# Patient Record
Sex: Female | Born: 1935 | Hispanic: No | Marital: Single | Smoking: Never smoker
Health system: Southern US, Community
[De-identification: ages and names within clinical notes are randomized; demographics above are authoritative.]

---
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
## 2005-12-24 ENCOUNTER — Ambulatory Visit: Payer: Self-pay

## 2014-05-25 ENCOUNTER — Encounter (HOSPITAL_COMMUNITY): Payer: Self-pay | Admitting: Emergency Medicine

## 2014-05-25 ENCOUNTER — Emergency Department (HOSPITAL_COMMUNITY)
Admission: EM | Admit: 2014-05-25 | Discharge: 2014-05-25 | Disposition: A | Discharge status: Home / Self Care | Payer: Medicare Other | Attending: Emergency Medicine | Admitting: Emergency Medicine

## 2014-05-25 ENCOUNTER — Emergency Department (HOSPITAL_COMMUNITY): Payer: Medicare Other

## 2014-05-25 DIAGNOSIS — G510 Bell's palsy: Secondary | ICD-10-CM

## 2014-05-25 LAB — APTT: aPTT: 32 seconds (ref 24–37)

## 2014-05-25 LAB — COMPREHENSIVE METABOLIC PANEL
ALK PHOS: 72 U/L (ref 39–117)
ALT: 13 U/L (ref 0–35)
AST: 23 U/L (ref 0–37)
Albumin: 4.1 g/dL (ref 3.5–5.2)
Anion gap: 16 — ABNORMAL HIGH (ref 5–15)
BUN: 9 mg/dL (ref 6–23)
CO2: 24 mEq/L (ref 19–32)
Calcium: 9.6 mg/dL (ref 8.4–10.5)
Chloride: 101 mEq/L (ref 96–112)
Creatinine, Ser: 0.58 mg/dL (ref 0.50–1.10)
GFR calc non Af Amer: 86 mL/min — ABNORMAL LOW (ref >90)
GLUCOSE: 100 mg/dL — AB (ref 70–99)
POTASSIUM: 3.8 meq/L (ref 3.7–5.3)
Sodium: 141 mEq/L (ref 137–147)
Total Bilirubin: <0.2 mg/dL — ABNORMAL LOW (ref 0.3–1.2)
Total Protein: 7.5 g/dL (ref 6.0–8.3)

## 2014-05-25 LAB — CBC
HEMATOCRIT: 38.4 % (ref 36.0–46.0)
HEMOGLOBIN: 12.9 g/dL (ref 12.0–15.0)
MCH: 30.5 pg (ref 26.0–34.0)
MCHC: 33.6 g/dL (ref 30.0–36.0)
MCV: 90.8 fL (ref 78.0–100.0)
Platelets: 285 10*3/uL (ref 150–400)
RBC: 4.23 MIL/uL (ref 3.87–5.11)
RDW: 13.7 % (ref 11.5–15.5)
WBC: 5.2 10*3/uL (ref 4.0–10.5)

## 2014-05-25 LAB — DIFFERENTIAL
Basophils Absolute: 0 10*3/uL (ref 0.0–0.1)
Basophils Relative: 1 % (ref 0–1)
EOS ABS: 0.1 10*3/uL (ref 0.0–0.7)
Eosinophils Relative: 2 % (ref 0–5)
LYMPHS ABS: 1.3 10*3/uL (ref 0.7–4.0)
Lymphocytes Relative: 25 % (ref 12–46)
MONOS PCT: 9 % (ref 3–12)
Monocytes Absolute: 0.5 10*3/uL (ref 0.1–1.0)
Neutro Abs: 3.3 10*3/uL (ref 1.7–7.7)
Neutrophils Relative %: 63 % (ref 43–77)

## 2014-05-25 LAB — PROTIME-INR
INR: 0.97 (ref 0.00–1.49)
Prothrombin Time: 12.9 seconds (ref 11.6–15.2)

## 2014-05-25 LAB — I-STAT TROPONIN, ED: Troponin i, poc: 0 ng/mL (ref 0.00–0.08)

## 2014-05-25 MED ORDER — PREDNISONE 50 MG PO TABS: 50.0000 mg | ORAL_TABLET | Freq: Every day | ORAL | Status: DC

## 2014-05-25 MED ORDER — ARTIFICIAL TEARS OP OINT
TOPICAL_OINTMENT | Freq: Once | OPHTHALMIC | Status: AC
  Administered 2014-05-25: 20:00:00 via OPHTHALMIC
  Filled 2014-05-25: qty 3.5

## 2014-05-25 NOTE — ED Provider Notes (Signed)
CSN: [id number]     Arrival date & time 05/25/14  1457 History   First MD Initiated Contact with Patient 05/25/14 1704     Chief Complaint  Patient presents with  . Facial Droop     (Consider location/radiation/quality/duration/timing/severity/associated sxs/prior Treatment) HPI Comments: Pt with no medical hx comes in with right sided facial droop since Friday. Pt had sudden onset facial droop. She thought she had bell's palsy, but got worried when her sx didn't improve. No hx of strokes. She has some pain in her right eye and tearing. No change in vision.   The history is provided by the patient.    History reviewed. No pertinent past medical history. History reviewed. No pertinent past surgical history. History reviewed. No pertinent family history. History  Substance Use Topics  . Smoking status: Never Smoker   . Smokeless tobacco: Not on file  . Alcohol Use: Yes     Comment: occ   OB History   Grav Para Term Preterm Abortions TAB SAB Ect Mult Living                 Review of Systems  Constitutional: Negative for activity change.  Eyes: Positive for photophobia, pain, discharge and itching. Negative for redness and visual disturbance.  Respiratory: Negative for shortness of breath.   Cardiovascular: Negative for chest pain.  Gastrointestinal: Negative for nausea.  Neurological: Positive for facial asymmetry. Negative for dizziness, tremors, syncope, speech difficulty, weakness and numbness.      Allergies  Other  Home Medications   Prior to Admission medications   Medication Sig Start Date End Date Taking? Authorizing Provider  predniSONE (DELTASONE) 50 MG tablet Take 1 tablet (50 mg total) by mouth daily. 05/25/14   Cejay Cambre Rhunette CroftNanavati, MD   BP 149/87  Pulse 78  Temp(Src) 98.6 F (37 C) (Oral)  Resp 25  Ht 5\' 1"  (1.549 m)  Wt 95 lb (43.092 kg)  BMI 17.96 kg/m2  SpO2 100% Physical Exam  Nursing note and vitals reviewed. Constitutional: She is oriented to  person, place, and time. She appears well-developed and well-nourished.  HENT:  Head: Normocephalic and atraumatic.  Eyes: EOM are normal. Pupils are equal, round, and reactive to light.  Neck: Neck supple.  Cardiovascular: Normal rate, regular rhythm and normal heart sounds.   No murmur heard. Pulmonary/Chest: Effort normal. No respiratory distress.  Abdominal: Soft. She exhibits no distension. There is no tenderness. There is no rebound and no guarding.  Neurological: She is alert and oriented to person, place, and time.  Right sided facial hemiparesis. No central sparing. Sensory exam for the face and bilateral upper and lower extremity is normal. Cerebellar exam - finger to nose and gait is normal. Motor exam for upper and lower extremity bilaterally is normal.  Skin: Skin is warm and dry.    ED Course  Procedures (including critical care time) Labs Review Labs Reviewed  COMPREHENSIVE METABOLIC PANEL - Abnormal; Notable for the following:    Glucose, Bld 100 (*)    Total Bilirubin <0.2 (*)    GFR calc non Af Amer 86 (*)    Anion gap 16 (*)    All other components within normal limits  PROTIME-INR  APTT  CBC  DIFFERENTIAL  I-STAT TROPOININ, ED    Imaging Review Ct Head (brain) Wo Contrast  05/25/2014   CLINICAL DATA:  Possible facial droop, facial paresthesias  EXAM: CT HEAD WITHOUT CONTRAST  TECHNIQUE: Contiguous axial images were obtained from the base of  the skull through the vertex without contrast.  COMPARISON:  None  FINDINGS: Normal appearance of the intracranial structures. No evidence for acute hemorrhage, mass lesion, midline shift, hydrocephalus or large infarct. No acute bony abnormality. The visualized sinuses are clear.  IMPRESSION: No acute intracranial abnormality.   Electronically Signed   By: Ruel Favors M.D.   On: 05/25/2014 17:19     EKG Interpretation   Date/Time:  Tuesday May 25 2014 15:12:22 EDT Ventricular Rate:  90 PR Interval:  126 QRS  Duration: 86 QT Interval:  362 QTC Calculation: 442 R Axis:   56 Text Interpretation:  Normal sinus rhythm Nonspecific ST abnormality  Abnormal ECG Confirmed by Rhunette Croft, MD, Janey Genta [phone]) on 05/25/2014 6:58:30  PM      MDM   Final diagnoses:  Bell's palsy    PT comes in with right sided facial numbness. Clinically has Bell's palsy. Triage had activated the stroke panel - and thus patient did get CT head which shows no stroke evidence. Outside the tx window for the valcyclovir, Aritificial tears provided from the ER and prednisone was prescribed. Pt advised to use an eye pad and glasses when outside, pcp f/u in 1 week.  Derwood Kaplan, MD 05/25/14 1946

## 2014-05-25 NOTE — ED Notes (Signed)
Pt reports having facial droop since Friday. Pt has not been seen by md yet bc she has assumed it is bells palsy. Right side facial droop noted, having watering to right eye, reports symptoms having intensified today and increase in droop, numbness to lower lip and now pain to right side of neck.

## 2014-05-25 NOTE — Discharge Instructions (Signed)
You have Bell's Palsy. Please take the steroids as prescribed. See your doctor in 1 week time. Use the artifical tears and eye patch/glasses.  Bell's Palsy Bell's palsy is a condition in which the muscles on one side of the face cannot move (paralysis). This is because the nerves in the face are paralyzed. It is most often thought to be caused by a virus. The virus causes swelling of the nerve that controls movement on one side of the face. The nerve travels through a tight space surrounded by bone. When the nerve swells, it can be compressed by the bone. This results in damage to the protective covering around the nerve. This damage interferes with how the nerve communicates with the muscles of the face. As a result, it can cause weakness or paralysis of the facial muscles.  Injury (trauma), tumor, and surgery may cause Bell's palsy, but most of the time the cause is unknown. It is a relatively common condition. It starts suddenly (abrupt onset) with the paralysis usually ending within 2 days. Bell's palsy is not dangerous. But because the eye does not close properly, you may need care to keep the eye from getting dry. This can include splinting (to keep the eye shut) or moistening with artificial tears. Bell's palsy very seldom occurs on both sides of the face at the same time. SYMPTOMS   Eyebrow sagging.  Drooping of the eyelid and corner of the mouth.  Inability to close one eye.  Loss of taste on the front of the tongue.  Sensitivity to loud noises. TREATMENT  The treatment is usually non-surgical. If the patient is seen within the first 24 to 48 hours, a short course of steroids may be prescribed, in an attempt to shorten the length of the condition. Antiviral medicines may also be used with the steroids, but it is unclear if they are helpful.  You will need to protect your eye, if you cannot close it. The cornea (clear covering over your eye) will become dry and can be damaged. Artificial  tears can be used to keep your eye moist. Glasses or an eye patch should be worn to protect your eye. PROGNOSIS  Recovery is variable, ranging from days to months. Although the problem usually goes away completely (about 80% of cases resolve), predicting the outcome is impossible. Most people improve within 3 weeks of when the symptoms began. Improvement may continue for 3 to 6 months. A small number of people have moderate to severe weakness that is permanent.  HOME CARE INSTRUCTIONS   If your caregiver prescribed medication to reduce swelling in the nerve, use as directed. Do not stop taking the medication unless directed by your caregiver.  Use moisturizing eye drops as needed to prevent drying of your eye, as directed by your caregiver.  Protect your eye, as directed by your caregiver.  Use facial massage and exercises, as directed by your caregiver.  Perform your normal activities, and get your normal rest. SEEK IMMEDIATE MEDICAL CARE IF:   There is pain, redness or irritation in the eye.  You or your child has an oral temperature above 102 F (38.9 C), not controlled by medicine. MAKE SURE YOU:   Understand these instructions.  Will watch your condition.  Will get help right away if you are not doing well or get worse. Document Released: 10/22/2005 Document Revised: 01/14/2012 Document Reviewed: 10/31/2009 [facility] Patient Information 2015 [place], [place]. This information is not intended to replace advice given to you by your  health care provider. Make sure you discuss any questions you have with your health care provider. ° °

## 2014-05-25 NOTE — ED Notes (Signed)
Waiting for medication from pharmacy.

## 2016-03-08 DIAGNOSIS — H5203 Hypermetropia, bilateral: Secondary | ICD-10-CM | POA: Diagnosis not present

## 2016-03-30 DIAGNOSIS — L509 Urticaria, unspecified: Secondary | ICD-10-CM | POA: Diagnosis not present

## 2016-03-30 DIAGNOSIS — W57XXXA Bitten or stung by nonvenomous insect and other nonvenomous arthropods, initial encounter: Secondary | ICD-10-CM | POA: Diagnosis not present

## 2016-10-11 DIAGNOSIS — Z0000 Encounter for general adult medical examination without abnormal findings: Secondary | ICD-10-CM | POA: Diagnosis not present

## 2016-10-11 DIAGNOSIS — Z780 Asymptomatic menopausal state: Secondary | ICD-10-CM | POA: Diagnosis not present

## 2016-10-22 DIAGNOSIS — Z0000 Encounter for general adult medical examination without abnormal findings: Secondary | ICD-10-CM | POA: Diagnosis not present

## 2016-10-22 DIAGNOSIS — E7800 Pure hypercholesterolemia, unspecified: Secondary | ICD-10-CM | POA: Diagnosis not present

## 2016-11-07 DIAGNOSIS — H04123 Dry eye syndrome of bilateral lacrimal glands: Secondary | ICD-10-CM | POA: Diagnosis not present

## 2016-11-07 DIAGNOSIS — H01004 Unspecified blepharitis left upper eyelid: Secondary | ICD-10-CM | POA: Diagnosis not present

## 2016-11-07 DIAGNOSIS — H01001 Unspecified blepharitis right upper eyelid: Secondary | ICD-10-CM | POA: Diagnosis not present

## 2016-11-12 DIAGNOSIS — M810 Age-related osteoporosis without current pathological fracture: Secondary | ICD-10-CM | POA: Diagnosis not present

## 2017-04-08 DIAGNOSIS — N390 Urinary tract infection, site not specified: Secondary | ICD-10-CM | POA: Diagnosis not present

## 2017-04-08 DIAGNOSIS — R300 Dysuria: Secondary | ICD-10-CM | POA: Diagnosis not present

## 2017-04-25 DIAGNOSIS — N390 Urinary tract infection, site not specified: Secondary | ICD-10-CM | POA: Diagnosis not present

## 2017-04-25 DIAGNOSIS — R300 Dysuria: Secondary | ICD-10-CM | POA: Diagnosis not present

## 2017-04-25 DIAGNOSIS — Z01419 Encounter for gynecological examination (general) (routine) without abnormal findings: Secondary | ICD-10-CM | POA: Diagnosis not present

## 2017-04-29 DIAGNOSIS — H04123 Dry eye syndrome of bilateral lacrimal glands: Secondary | ICD-10-CM | POA: Diagnosis not present

## 2017-04-29 DIAGNOSIS — H35373 Puckering of macula, bilateral: Secondary | ICD-10-CM | POA: Diagnosis not present

## 2017-07-17 ENCOUNTER — Encounter (HOSPITAL_COMMUNITY): Payer: Self-pay | Admitting: Emergency Medicine

## 2017-07-17 ENCOUNTER — Emergency Department (HOSPITAL_COMMUNITY): Payer: Medicare Other

## 2017-07-17 ENCOUNTER — Emergency Department (HOSPITAL_COMMUNITY)
Admission: EM | Admit: 2017-07-17 | Discharge: 2017-07-17 | Disposition: A | Discharge status: Home / Self Care | Payer: Medicare Other | Attending: Emergency Medicine | Admitting: Emergency Medicine

## 2017-07-17 DIAGNOSIS — Y929 Unspecified place or not applicable: Secondary | ICD-10-CM | POA: Insufficient documentation

## 2017-07-17 DIAGNOSIS — M25571 Pain in right ankle and joints of right foot: Secondary | ICD-10-CM | POA: Diagnosis not present

## 2017-07-17 DIAGNOSIS — W108XXA Fall (on) (from) other stairs and steps, initial encounter: Secondary | ICD-10-CM | POA: Insufficient documentation

## 2017-07-17 DIAGNOSIS — S93401A Sprain of unspecified ligament of right ankle, initial encounter: Secondary | ICD-10-CM | POA: Diagnosis not present

## 2017-07-17 DIAGNOSIS — Y999 Unspecified external cause status: Secondary | ICD-10-CM | POA: Diagnosis not present

## 2017-07-17 DIAGNOSIS — Y939 Activity, unspecified: Secondary | ICD-10-CM | POA: Diagnosis not present

## 2017-07-17 DIAGNOSIS — S99911A Unspecified injury of right ankle, initial encounter: Secondary | ICD-10-CM | POA: Diagnosis present

## 2017-07-17 MED ORDER — ACETAMINOPHEN 500 MG PO TABS
1000.0000 mg | ORAL_TABLET | Freq: Once | ORAL | Status: AC
  Administered 2017-07-17: 1000 mg via ORAL
  Filled 2017-07-17: qty 2

## 2017-07-17 MED ORDER — ACETAMINOPHEN 500 MG PO TABS: 500.0000 mg | ORAL_TABLET | Freq: Four times a day (QID) | ORAL | 0 refills | Status: AC | PRN

## 2017-07-17 NOTE — ED Notes (Signed)
Patient returned from X-ray 

## 2017-07-17 NOTE — ED Provider Notes (Signed)
[facility]-EMERGENCY DEPT Provider Note   CSN: [id number] Arrival date & time: 07/17/17  0547     History   Chief Complaint Chief Complaint  Patient presents with  . Ankle Pain    HPI Samantha Burns is a 83 y.o. female.  HPI   83 year old female presenting for evaluation of right ankle injury. Pt report she was stepping down from a step stool last night when she missteped and inverted her R ankle.  She felt a pop follow with sharp non radiating pain to the ankle.  Denies falling or injuring anything else.  Denies hitting head, LOC or having any precipitating sxs.  She's able to ambulate with some difficulty. Pt have been icing and elevating her ankle.  She is scheduled to fly to [place] tomorrow for a 10 days hiking trip.  Report prior fx of R ankle and was cared for by [facility].      History reviewed. No pertinent past medical history.  There are no active problems to display for this patient.   History reviewed. No pertinent surgical history.  OB History    No data available       Home Medications    Prior to Admission medications   Medication Sig Start Date End Date Taking? Authorizing Provider  predniSONE (DELTASONE) 50 MG tablet Take 1 tablet (50 mg total) by mouth daily. 05/25/14   Derwood Kaplan, MD    Family History No family history on file.  Social History Social History  Substance Use Topics  . Smoking status: Never Smoker  . Smokeless tobacco: Never Used  . Alcohol use Yes     Comment: occ     Allergies   Other   Review of Systems Review of Systems  Constitutional: Negative for fever.  Musculoskeletal: Positive for joint swelling.  Skin: Negative for wound.  Neurological: Negative for numbness.     Physical Exam Updated Vital Signs BP (!) 168/98 (BP Location: Right Arm)   Pulse 88   Temp 98.5 F (36.9 C) (Oral)   Resp 18   SpO2 99%   Physical Exam  Constitutional: She appears well-developed and well-nourished. No  distress.  HENT:  Head: Atraumatic.  Eyes: Conjunctivae are normal.  Neck: Neck supple.  Musculoskeletal: She exhibits tenderness (R ankle: point tenderness to lateral malleolar region with mild swelling. FROM, pedal pulses intact.  no pain at 5th metatarsal region.).  Neurological: She is alert.  Skin: No rash noted.  Psychiatric: She has a normal mood and affect.  Nursing note and vitals reviewed.    ED Treatments / Results  Labs (all labs ordered are listed, but only abnormal results are displayed) Labs Reviewed - No data to display  EKG  EKG Interpretation None       Radiology Dg Ankle Complete Right  Result Date: 07/17/2017 CLINICAL DATA:  Right ankle pain after injury. Twisting injury stepping off the stool with pain laterally. Cannot bear weight. EXAM: RIGHT ANKLE - COMPLETE 3+ VIEW COMPARISON:  None. FINDINGS: There is no evidence of fracture, dislocation, or joint effusion. The bones are under mineralized. There is no evidence of arthropathy or other focal bone abnormality. Mild anterior soft tissue edema. IMPRESSION: No fracture or subluxation of the right ankle. Electronically Signed   By: Rubye Oaks M.D.   On: 07/17/2017 06:49    Procedures Procedures (including critical care time)  Medications Ordered in ED Medications  acetaminophen (TYLENOL) tablet 1,000 mg (1,000 mg Oral Given 07/17/17 0657)  Initial Impression / Assessment and Plan / ED Course  I have reviewed the triage vital signs and the nursing notes.  Pertinent labs & imaging results that were available during my care of the patient were reviewed by me and considered in my medical decision making (see chart for details).     BP (!) 168/98 (BP Location: Right Arm)   Pulse 88   Temp 98.5 F (36.9 C) (Oral)   Resp 18   SpO2 99%  The patient was noted to be hypertensive today in the emergency department. I have spoken with the patient regarding hypertension and the need for improved  management. I instructed the patient to followup with the Primary care doctor within 4 days to improve the management of the patient's hypertension. I also counseled the patient regarding the signs and symptoms which would require an emergent visit to an emergency department for hypertensive urgency and/or hypertensive emergency. The patient understood the need for improved hypertensive management.   Final Clinical Impressions(s) / ED Diagnoses   Final diagnoses:  Sprain of right ankle, unspecified ligament, initial encounter    New Prescriptions New Prescriptions   ACETAMINOPHEN (TYLENOL) 500 MG TABLET    Take 1 tablet (500 mg total) by mouth every 6 (six) hours as needed.   6:48 AM Pt here with mechanical injury of R ankle, xray demonstrates a sprain.  She is NVI.  Pt does not want crutches and request ACE wrap only.  Tylenol given for pain.  She is scheduled for a 10 days hike in [place] and request a note to allow her to cancel the trip.  Note provided.  Care discussed with Dr. Dellie BurnsWard   Garek Schuneman, Greta DoomBowie, PA-C 07/17/17 [phone]

## 2017-07-17 NOTE — ED Notes (Signed)
Patient transported to X-ray 

## 2017-07-17 NOTE — ED Provider Notes (Signed)
Medical screening examination/treatment/procedure(s) were conducted as a shared visit with non-physician practitioner(s) and myself.  I personally evaluated the patient during the encounter.   EKG Interpretation None      Patient is an 83 year old healthy female who presents emergency department with right ankle injury. States she stepped wrong off of a stepladder twisting her right ankle yesterday. Has pain over the lateral malleolus. No ligamentous laxity on exam. She is neurovascular intact distally and has strong right DP pulse. There is no pain over the proximal fibular head of the right leg. She did not hit her head or fall to the ground. She has been able to ambulate but states it does cause her pain. She is supposed to go on a trip to home on a 10 day hiking excursion. We have discussed that this is likely not appropriate at this time given her ankle sprain. She states she does have trip insurance. I recommended rest, elevation and ice. X-ray does not show any fracture or dislocation. We'll place her in an Ace wrap. Will give Tylenol for pain control at home. She declines crutches.     Jaziah Goeller, Layla MawKristen N, DO 07/17/17 [phone]

## 2017-07-17 NOTE — ED Triage Notes (Signed)
Patient in from home with complaints of right ankle pain after fall last night. Reports that she is scheduled to take a flight to go hiking for 10 days. Able to apply some pressure.

## 2017-07-19 DIAGNOSIS — M25571 Pain in right ankle and joints of right foot: Secondary | ICD-10-CM | POA: Diagnosis not present

## 2017-07-19 DIAGNOSIS — M79641 Pain in right hand: Secondary | ICD-10-CM | POA: Diagnosis not present

## 2017-08-19 DIAGNOSIS — M25571 Pain in right ankle and joints of right foot: Secondary | ICD-10-CM | POA: Diagnosis not present

## 2017-10-21 DIAGNOSIS — Z0000 Encounter for general adult medical examination without abnormal findings: Secondary | ICD-10-CM | POA: Diagnosis not present

## 2017-10-21 DIAGNOSIS — E7800 Pure hypercholesterolemia, unspecified: Secondary | ICD-10-CM | POA: Diagnosis not present

## 2017-11-07 DIAGNOSIS — Z8249 Family history of ischemic heart disease and other diseases of the circulatory system: Secondary | ICD-10-CM | POA: Diagnosis not present

## 2017-11-07 DIAGNOSIS — R109 Unspecified abdominal pain: Secondary | ICD-10-CM | POA: Diagnosis not present

## 2017-11-07 DIAGNOSIS — M810 Age-related osteoporosis without current pathological fracture: Secondary | ICD-10-CM | POA: Diagnosis not present

## 2017-11-07 DIAGNOSIS — S3991XA Unspecified injury of abdomen, initial encounter: Secondary | ICD-10-CM | POA: Diagnosis not present

## 2017-11-07 DIAGNOSIS — S299XXA Unspecified injury of thorax, initial encounter: Secondary | ICD-10-CM | POA: Diagnosis not present

## 2017-11-07 DIAGNOSIS — Z0000 Encounter for general adult medical examination without abnormal findings: Secondary | ICD-10-CM | POA: Diagnosis not present

## 2017-11-07 DIAGNOSIS — R0781 Pleurodynia: Secondary | ICD-10-CM | POA: Diagnosis not present

## 2017-11-12 DIAGNOSIS — B029 Zoster without complications: Secondary | ICD-10-CM | POA: Diagnosis not present

## 2017-11-19 DIAGNOSIS — B029 Zoster without complications: Secondary | ICD-10-CM | POA: Diagnosis not present

## 2017-11-20 DIAGNOSIS — R9431 Abnormal electrocardiogram [ECG] [EKG]: Secondary | ICD-10-CM | POA: Diagnosis not present

## 2017-11-20 DIAGNOSIS — R030 Elevated blood-pressure reading, without diagnosis of hypertension: Secondary | ICD-10-CM | POA: Diagnosis not present

## 2017-11-21 DIAGNOSIS — E7800 Pure hypercholesterolemia, unspecified: Secondary | ICD-10-CM | POA: Diagnosis not present

## 2017-11-21 DIAGNOSIS — I739 Peripheral vascular disease, unspecified: Secondary | ICD-10-CM | POA: Diagnosis not present

## 2017-11-21 DIAGNOSIS — I77811 Abdominal aortic ectasia: Secondary | ICD-10-CM | POA: Diagnosis not present

## 2017-11-21 DIAGNOSIS — Z8249 Family history of ischemic heart disease and other diseases of the circulatory system: Secondary | ICD-10-CM | POA: Diagnosis not present

## 2017-11-21 DIAGNOSIS — R0989 Other specified symptoms and signs involving the circulatory and respiratory systems: Secondary | ICD-10-CM | POA: Diagnosis not present

## 2017-11-21 DIAGNOSIS — Z136 Encounter for screening for cardiovascular disorders: Secondary | ICD-10-CM | POA: Diagnosis not present

## 2017-12-06 DIAGNOSIS — R0989 Other specified symptoms and signs involving the circulatory and respiratory systems: Secondary | ICD-10-CM | POA: Diagnosis not present

## 2017-12-06 DIAGNOSIS — J029 Acute pharyngitis, unspecified: Secondary | ICD-10-CM | POA: Diagnosis not present

## 2018-11-04 DIAGNOSIS — Z0000 Encounter for general adult medical examination without abnormal findings: Secondary | ICD-10-CM | POA: Diagnosis not present

## 2018-11-04 DIAGNOSIS — E7800 Pure hypercholesterolemia, unspecified: Secondary | ICD-10-CM | POA: Diagnosis not present

## 2018-11-10 DIAGNOSIS — E7800 Pure hypercholesterolemia, unspecified: Secondary | ICD-10-CM | POA: Diagnosis not present

## 2018-11-10 DIAGNOSIS — Z0000 Encounter for general adult medical examination without abnormal findings: Secondary | ICD-10-CM | POA: Diagnosis not present

## 2018-11-10 DIAGNOSIS — Z1212 Encounter for screening for malignant neoplasm of rectum: Secondary | ICD-10-CM | POA: Diagnosis not present

## 2018-11-10 DIAGNOSIS — Z01419 Encounter for gynecological examination (general) (routine) without abnormal findings: Secondary | ICD-10-CM | POA: Diagnosis not present

## 2018-11-21 DIAGNOSIS — S39012A Strain of muscle, fascia and tendon of lower back, initial encounter: Secondary | ICD-10-CM | POA: Diagnosis not present

## 2018-12-26 IMAGING — CR DG ANKLE COMPLETE 3+V*R*
3 series · 3 of 3 positions shown · non-contrast
Comparison: None.

CLINICAL DATA: Right ankle pain after injury. Twisting injury
stepping off the stool with pain laterally. Cannot bear weight.

EXAM:
RIGHT ANKLE - COMPLETE 3+ VIEW

[x ankle ap right]
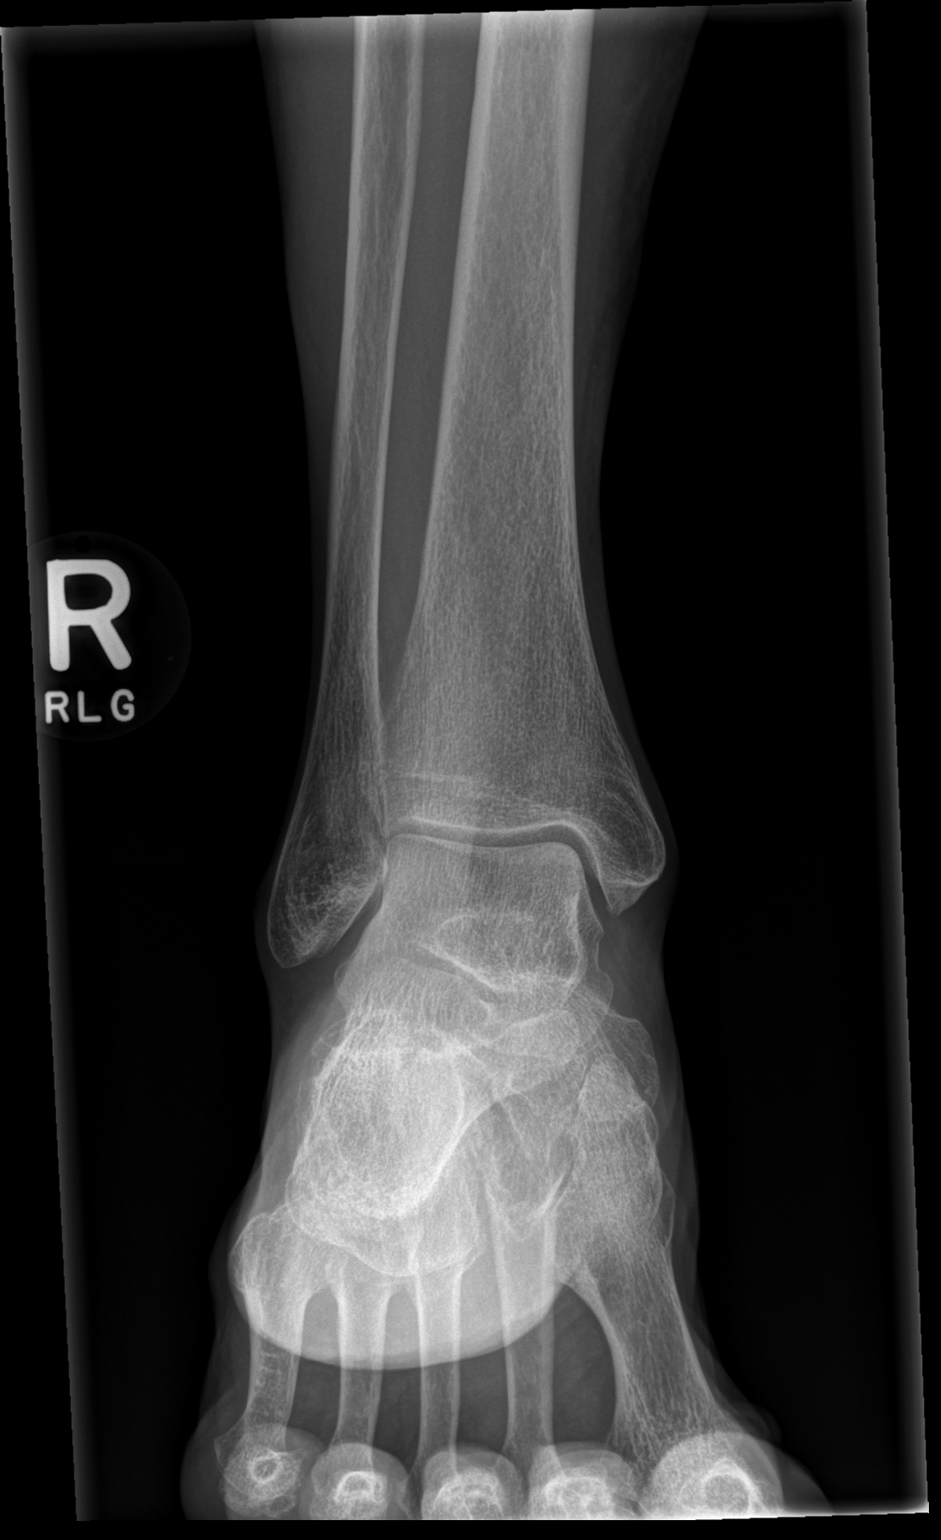

[x ankle obl right]
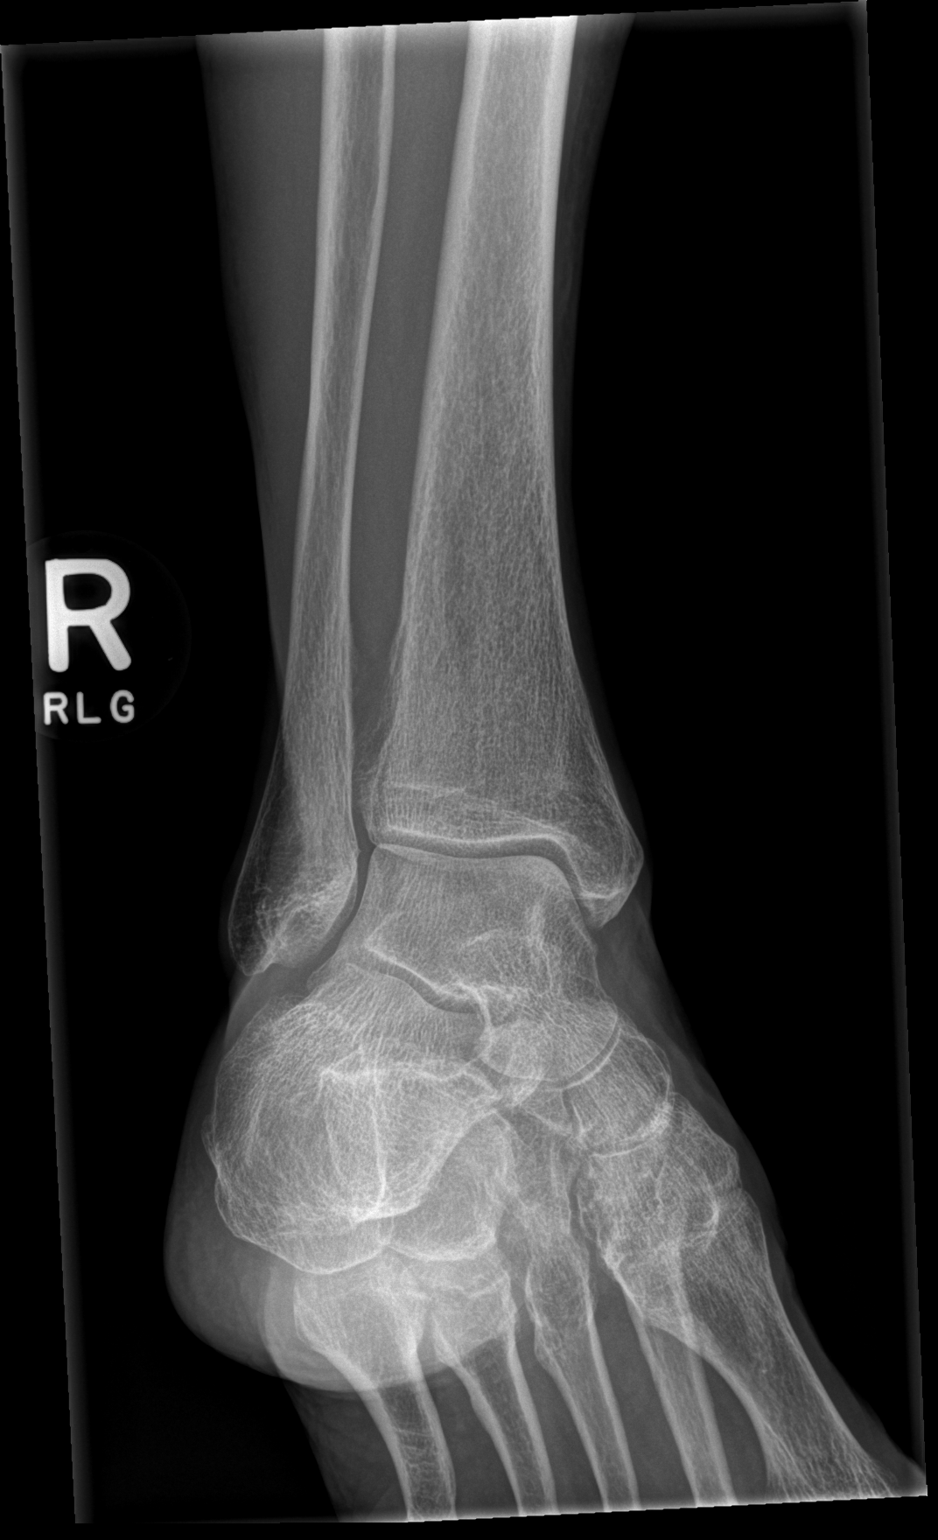

[x ankle lat right]
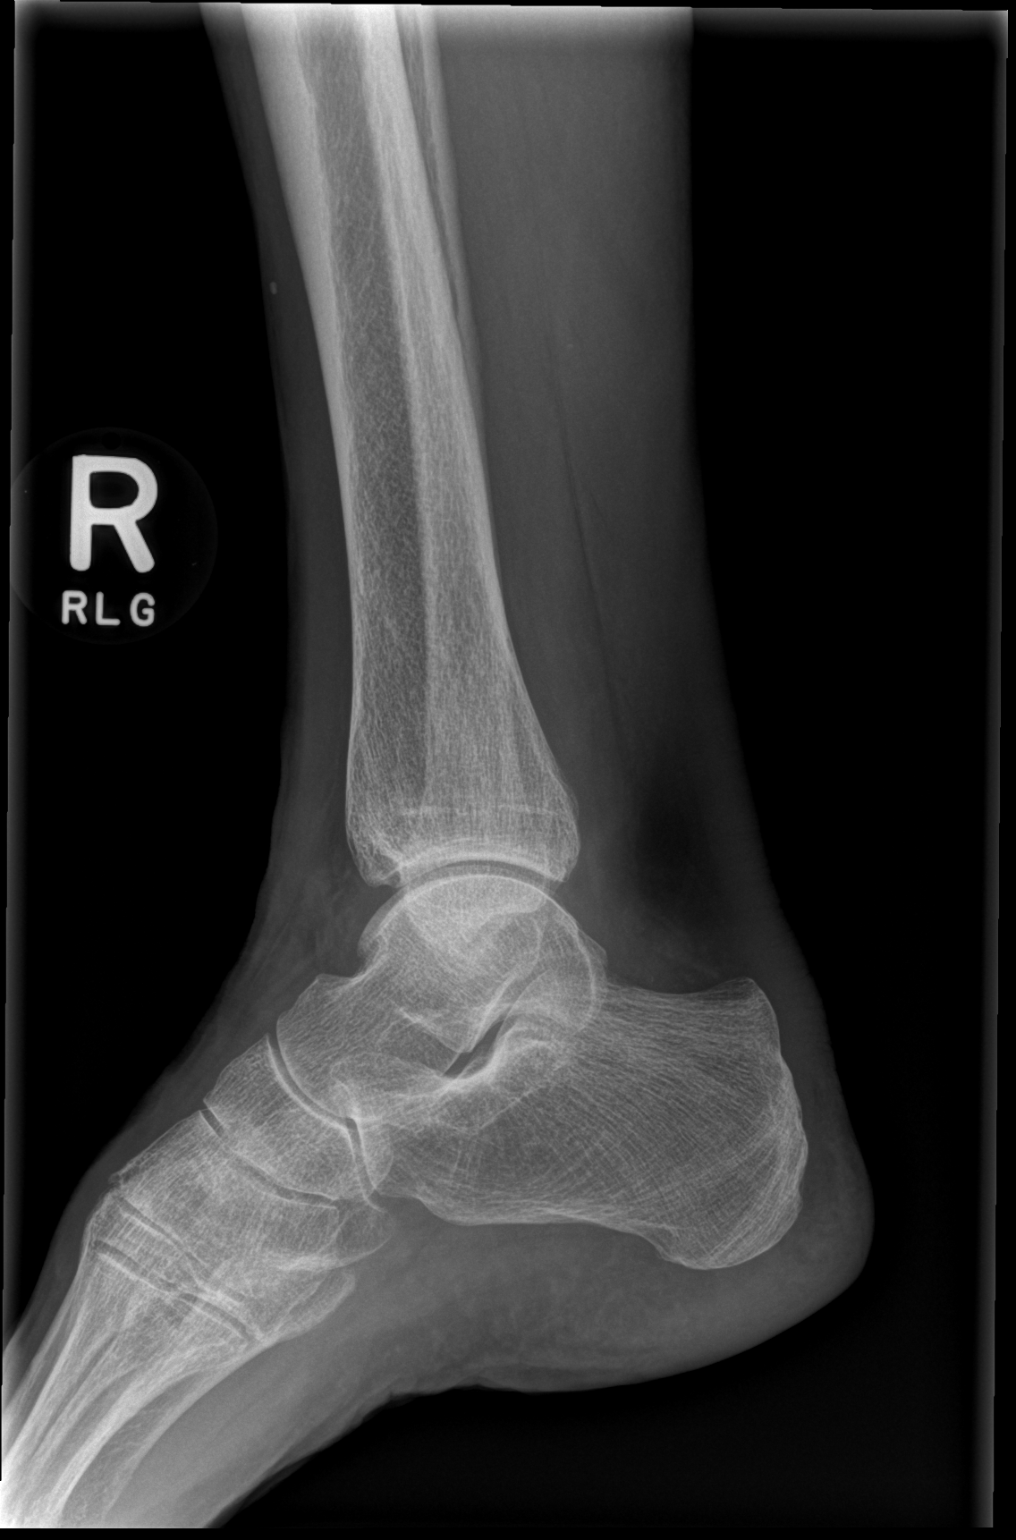

[3 of 3 positions shown; findings below may reference images not displayed]

FINDINGS: There is no evidence of fracture, dislocation, or joint effusion.
The bones are under mineralized. There is no evidence of arthropathy
or other focal bone abnormality. Mild anterior soft tissue edema.
IMPRESSION: No fracture or subluxation of the right ankle.

## 2019-05-01 DIAGNOSIS — B370 Candidal stomatitis: Secondary | ICD-10-CM | POA: Diagnosis not present

## 2019-11-09 DIAGNOSIS — Z0000 Encounter for general adult medical examination without abnormal findings: Secondary | ICD-10-CM | POA: Diagnosis not present

## 2019-11-09 DIAGNOSIS — E7800 Pure hypercholesterolemia, unspecified: Secondary | ICD-10-CM | POA: Diagnosis not present

## 2019-11-09 DIAGNOSIS — M810 Age-related osteoporosis without current pathological fracture: Secondary | ICD-10-CM | POA: Diagnosis not present

## 2019-11-19 DIAGNOSIS — E7800 Pure hypercholesterolemia, unspecified: Secondary | ICD-10-CM | POA: Diagnosis not present

## 2019-11-19 DIAGNOSIS — M810 Age-related osteoporosis without current pathological fracture: Secondary | ICD-10-CM | POA: Diagnosis not present

## 2019-11-19 DIAGNOSIS — M25751 Osteophyte, right hip: Secondary | ICD-10-CM | POA: Diagnosis not present

## 2019-11-19 DIAGNOSIS — M25551 Pain in right hip: Secondary | ICD-10-CM | POA: Diagnosis not present

## 2019-11-19 DIAGNOSIS — B370 Candidal stomatitis: Secondary | ICD-10-CM | POA: Diagnosis not present

## 2019-11-19 DIAGNOSIS — Z0000 Encounter for general adult medical examination without abnormal findings: Secondary | ICD-10-CM | POA: Diagnosis not present

## 2019-12-29 DIAGNOSIS — K219 Gastro-esophageal reflux disease without esophagitis: Secondary | ICD-10-CM | POA: Diagnosis not present

## 2019-12-29 DIAGNOSIS — B370 Candidal stomatitis: Secondary | ICD-10-CM | POA: Diagnosis not present

## 2020-02-17 DIAGNOSIS — B370 Candidal stomatitis: Secondary | ICD-10-CM | POA: Diagnosis not present

## 2020-02-17 DIAGNOSIS — E559 Vitamin D deficiency, unspecified: Secondary | ICD-10-CM | POA: Diagnosis not present

## 2020-11-09 DIAGNOSIS — Z0000 Encounter for general adult medical examination without abnormal findings: Secondary | ICD-10-CM | POA: Diagnosis not present

## 2020-11-09 DIAGNOSIS — M810 Age-related osteoporosis without current pathological fracture: Secondary | ICD-10-CM | POA: Diagnosis not present

## 2020-11-09 DIAGNOSIS — E559 Vitamin D deficiency, unspecified: Secondary | ICD-10-CM | POA: Diagnosis not present

## 2020-11-09 DIAGNOSIS — E7800 Pure hypercholesterolemia, unspecified: Secondary | ICD-10-CM | POA: Diagnosis not present

## 2020-11-14 DIAGNOSIS — K219 Gastro-esophageal reflux disease without esophagitis: Secondary | ICD-10-CM | POA: Diagnosis not present

## 2020-11-14 DIAGNOSIS — E559 Vitamin D deficiency, unspecified: Secondary | ICD-10-CM | POA: Diagnosis not present

## 2020-11-14 DIAGNOSIS — Z0000 Encounter for general adult medical examination without abnormal findings: Secondary | ICD-10-CM | POA: Diagnosis not present

## 2020-11-14 DIAGNOSIS — N3281 Overactive bladder: Secondary | ICD-10-CM | POA: Diagnosis not present

## 2020-11-15 DIAGNOSIS — I77811 Abdominal aortic ectasia: Secondary | ICD-10-CM | POA: Diagnosis not present

## 2020-11-15 DIAGNOSIS — Z8249 Family history of ischemic heart disease and other diseases of the circulatory system: Secondary | ICD-10-CM | POA: Diagnosis not present

## 2020-11-15 DIAGNOSIS — E7800 Pure hypercholesterolemia, unspecified: Secondary | ICD-10-CM | POA: Diagnosis not present

## 2020-11-15 DIAGNOSIS — M810 Age-related osteoporosis without current pathological fracture: Secondary | ICD-10-CM | POA: Diagnosis not present

## 2021-06-13 DIAGNOSIS — D1801 Hemangioma of skin and subcutaneous tissue: Secondary | ICD-10-CM | POA: Diagnosis not present

## 2021-06-13 DIAGNOSIS — L299 Pruritus, unspecified: Secondary | ICD-10-CM | POA: Diagnosis not present

## 2021-06-13 DIAGNOSIS — L219 Seborrheic dermatitis, unspecified: Secondary | ICD-10-CM | POA: Diagnosis not present

## 2021-06-13 DIAGNOSIS — Z0000 Encounter for general adult medical examination without abnormal findings: Secondary | ICD-10-CM | POA: Diagnosis not present

## 2021-06-14 DIAGNOSIS — E7800 Pure hypercholesterolemia, unspecified: Secondary | ICD-10-CM | POA: Diagnosis not present

## 2021-06-14 DIAGNOSIS — E559 Vitamin D deficiency, unspecified: Secondary | ICD-10-CM | POA: Diagnosis not present

## 2021-06-16 DIAGNOSIS — L219 Seborrheic dermatitis, unspecified: Secondary | ICD-10-CM | POA: Diagnosis not present

## 2021-06-16 DIAGNOSIS — B351 Tinea unguium: Secondary | ICD-10-CM | POA: Diagnosis not present

## 2021-06-19 DIAGNOSIS — Z961 Presence of intraocular lens: Secondary | ICD-10-CM | POA: Diagnosis not present

## 2021-06-19 DIAGNOSIS — H04123 Dry eye syndrome of bilateral lacrimal glands: Secondary | ICD-10-CM | POA: Diagnosis not present

## 2021-06-19 DIAGNOSIS — H35373 Puckering of macula, bilateral: Secondary | ICD-10-CM | POA: Diagnosis not present

## 2021-06-19 DIAGNOSIS — H02831 Dermatochalasis of right upper eyelid: Secondary | ICD-10-CM | POA: Diagnosis not present

## 2021-07-06 DIAGNOSIS — H02831 Dermatochalasis of right upper eyelid: Secondary | ICD-10-CM | POA: Diagnosis not present

## 2021-07-06 DIAGNOSIS — H02423 Myogenic ptosis of bilateral eyelids: Secondary | ICD-10-CM | POA: Diagnosis not present

## 2021-07-06 DIAGNOSIS — H02834 Dermatochalasis of left upper eyelid: Secondary | ICD-10-CM | POA: Diagnosis not present

## 2021-07-06 DIAGNOSIS — H02413 Mechanical ptosis of bilateral eyelids: Secondary | ICD-10-CM | POA: Diagnosis not present

## 2021-07-19 DIAGNOSIS — B351 Tinea unguium: Secondary | ICD-10-CM | POA: Diagnosis not present

## 2021-07-19 DIAGNOSIS — L603 Nail dystrophy: Secondary | ICD-10-CM | POA: Diagnosis not present

## 2021-07-25 DIAGNOSIS — H53483 Generalized contraction of visual field, bilateral: Secondary | ICD-10-CM | POA: Diagnosis not present

## 2021-09-08 DIAGNOSIS — L219 Seborrheic dermatitis, unspecified: Secondary | ICD-10-CM | POA: Diagnosis not present

## 2021-09-27 DIAGNOSIS — L0100 Impetigo, unspecified: Secondary | ICD-10-CM | POA: Diagnosis not present

## 2021-09-27 DIAGNOSIS — L218 Other seborrheic dermatitis: Secondary | ICD-10-CM | POA: Diagnosis not present

## 2021-10-18 DIAGNOSIS — B351 Tinea unguium: Secondary | ICD-10-CM | POA: Diagnosis not present

## 2021-11-16 DIAGNOSIS — E559 Vitamin D deficiency, unspecified: Secondary | ICD-10-CM | POA: Diagnosis not present

## 2021-11-16 DIAGNOSIS — Z0000 Encounter for general adult medical examination without abnormal findings: Secondary | ICD-10-CM | POA: Diagnosis not present

## 2021-11-16 DIAGNOSIS — E7800 Pure hypercholesterolemia, unspecified: Secondary | ICD-10-CM | POA: Diagnosis not present

## 2021-11-20 DIAGNOSIS — I10 Essential (primary) hypertension: Secondary | ICD-10-CM | POA: Diagnosis not present

## 2021-11-20 DIAGNOSIS — Z0000 Encounter for general adult medical examination without abnormal findings: Secondary | ICD-10-CM | POA: Diagnosis not present

## 2021-11-20 DIAGNOSIS — M810 Age-related osteoporosis without current pathological fracture: Secondary | ICD-10-CM | POA: Diagnosis not present

## 2021-11-20 DIAGNOSIS — E559 Vitamin D deficiency, unspecified: Secondary | ICD-10-CM | POA: Diagnosis not present

## 2021-11-23 DIAGNOSIS — R319 Hematuria, unspecified: Secondary | ICD-10-CM | POA: Diagnosis not present

## 2022-03-14 DIAGNOSIS — B351 Tinea unguium: Secondary | ICD-10-CM | POA: Diagnosis not present

## 2022-06-22 DIAGNOSIS — B351 Tinea unguium: Secondary | ICD-10-CM | POA: Diagnosis not present

## 2022-06-25 DIAGNOSIS — N3946 Mixed incontinence: Secondary | ICD-10-CM | POA: Diagnosis not present

## 2022-06-25 DIAGNOSIS — R079 Chest pain, unspecified: Secondary | ICD-10-CM | POA: Diagnosis not present

## 2022-06-27 DIAGNOSIS — N3946 Mixed incontinence: Secondary | ICD-10-CM | POA: Diagnosis not present

## 2022-06-29 DIAGNOSIS — L820 Inflamed seborrheic keratosis: Secondary | ICD-10-CM | POA: Diagnosis not present

## 2022-06-29 DIAGNOSIS — L218 Other seborrheic dermatitis: Secondary | ICD-10-CM | POA: Diagnosis not present

## 2022-07-03 ENCOUNTER — Ambulatory Visit: Payer: Medicare Other | Admitting: Internal Medicine

## 2022-07-03 ENCOUNTER — Encounter: Payer: Self-pay | Admitting: Internal Medicine

## 2022-07-03 VITALS — Temp 98.0°F | Resp 16 | Ht 61.0 in | Wt 95.0 lb

## 2022-07-03 DIAGNOSIS — R072 Precordial pain: Secondary | ICD-10-CM

## 2022-07-03 DIAGNOSIS — I10 Essential (primary) hypertension: Secondary | ICD-10-CM | POA: Insufficient documentation

## 2022-07-03 DIAGNOSIS — E785 Hyperlipidemia, unspecified: Secondary | ICD-10-CM | POA: Insufficient documentation

## 2022-07-03 DIAGNOSIS — T463X5A Adverse effect of coronary vasodilators, initial encounter: Secondary | ICD-10-CM | POA: Insufficient documentation

## 2022-07-03 DIAGNOSIS — R011 Cardiac murmur, unspecified: Secondary | ICD-10-CM

## 2022-07-03 MED ORDER — ASPIRIN 81 MG PO CHEW: 81.0000 mg | CHEWABLE_TABLET | Freq: Every day | ORAL | Status: AC

## 2022-07-03 MED ORDER — NITROGLYCERIN 0.4 MG SL SUBL: 0.4000 mg | SUBLINGUAL_TABLET | SUBLINGUAL | Status: AC | PRN

## 2022-07-03 MED ORDER — ROSUVASTATIN CALCIUM 5 MG PO TABS: 5.0000 mg | ORAL_TABLET | Freq: Every day | ORAL | 2 refills | Status: DC

## 2022-07-03 NOTE — Progress Notes (Signed)
Primary Physician/Referring:  Merri Brunette, MD  Patient ID: Samantha Burns, female    DOB: 04-17-1933, 88 y.o.   MRN: [id number]  Chief Complaint  Patient presents with   Chest Pain   New Patient (Initial Visit)   Referral   HPI:    Samantha Burns  is a 88 y.o. female with no reported past cardiac history who presents with chest pain on exertion and she is here to establish care with cardiology.  Patient is very active, she goes horseback riding, boating, and she gardens and does all of the cleaning outside and inside her house.  Recently she has noticed when she is gardening and going up her driveway which is very long and uphill she will develop chest pain.  When she is going uphill in her driveway, she has to stop every so often due to the chest pain.  This is new for her.  She has never been on any blood pressure medications though her blood pressure significantly elevated today.  Patient was given 0.4 mg of sublingual nitro to which she had a severe reaction.  She became diaphoretic, lightheaded, and very hot.  I laid the patient down and elevated her legs, gave her some water to drink.  After about 10 minutes patient was feeling better.  We repeated her blood pressure and EKG at this time.  Her blood pressure was better at 154/90 and her EKG was unchanged from previous.  She denies ever having chest pain with activity in the past.  She denies claudication, edema, PND, orthopnea, syncope.  Past Medical History:  Diagnosis Date   Heart murmur 04/1944   History reviewed. No pertinent surgical history. Family History  Problem Relation Age of Onset   Heart disease Brother     Social History   Tobacco Use   Smoking status: Never   Smokeless tobacco: Never  Substance Use Topics   Alcohol use: Not Currently   Marital Status: Single  ROS  Review of Systems  Cardiovascular:  Positive for chest pain, dyspnea on exertion, near-syncope and palpitations.  All other systems reviewed and  are negative.  Objective  Temperature 98 F (36.7 C), temperature source Temporal, resp. rate 16, height 5\' 1"  (1.549 m), weight 95 lb (43.1 kg), SpO2 97 %. Body mass index is 17.95 kg/m.     07/03/2022    9:28 AM 07/17/2017    7:46 AM 07/17/2017    5:57 AM  Vitals with BMI  Height 5\' 1"     Weight 95 lbs    BMI 17.96    Systolic  142 168  Diastolic  78 98  Pulse  87 88     Physical Exam Vitals and nursing note reviewed.  Constitutional:      General: She is not in acute distress.    Appearance: Normal appearance.  HENT:     Head: Normocephalic and atraumatic.  Neck:     Vascular: No carotid bruit.  Cardiovascular:     Rate and Rhythm: Normal rate and regular rhythm.     Pulses: Normal pulses.     Heart sounds: Murmur heard.     No friction rub. No gallop.  Pulmonary:     Effort: Pulmonary effort is normal.     Breath sounds: Normal breath sounds.  Abdominal:     General: Abdomen is flat. Bowel sounds are normal.     Palpations: Abdomen is soft.  Musculoskeletal:     Right lower leg: No edema.  Left lower leg: No edema.  Skin:    General: Skin is warm and dry.  Neurological:     Mental Status: She is alert.   Medications and allergies   Allergies  Allergen Reactions   Other     Reports all anesthesia causes n/v     Medication list after today's encounter   Current Outpatient Medications:    10894, TAKE ONE CAPSULE BY MOUTH 30 MINUTES prior TO morning meal, Disp: , Rfl:    acetaminophen (TYLENOL) 500 MG tablet, Take 1 tablet (500 mg total) by mouth every 6 (six) hours as needed., Disp: 30 tablet, Rfl: 0   ketoconazole (NIZORAL) 2 % shampoo, USE as directed Externally Twice per week, Disp: , Rfl:    KETOCONAZOLE, TOPICAL, 1 % SHAM, APPLY SHAMPOO BY TOPICAL ROUTE EVERY 3 DAYS LATHER, RINSE, AND REPEAT, Disp: , Rfl:    Naproxen (NAPROSYN PO), as needed., Disp: , Rfl:    predniSONE (DELTASONE) 50 MG tablet, Take 1 tablet (50 mg total) by mouth daily., Disp: 7  tablet, Rfl: 0   rosuvastatin (CRESTOR) 5 MG tablet, Take 1 tablet (5 mg total) by mouth at bedtime., Disp: 30 tablet, Rfl: 2  Current Facility-Administered Medications:    aspirin chewable tablet 81 mg, 81 mg, Oral, Daily, Kalecia Hartney, DO   nitroGLYCERIN (NITROSTAT) SL tablet 0.4 mg, 0.4 mg, Sublingual, Q5 min PRN, Jude Linck, DO  Laboratory examination:   Lab Results  Component Value Date   NA 141 05/25/2014   K 3.8 05/25/2014   CO2 24 05/25/2014   GLUCOSE 100 (H) 05/25/2014   BUN 9 05/25/2014   CREATININE 0.58 05/25/2014   CALCIUM 9.6 05/25/2014   GFRNONAA 86 (L) 05/25/2014       Latest Ref Rng & Units 05/25/2014    1:18 PM  CMP  Glucose 70 - 99 mg/dL 902   BUN 6 - 23 mg/dL 9   Creatinine 4.09 - 7.35 mg/dL 3.29   Sodium 924 - 268 mEq/L 141   Potassium 3.7 - 5.3 mEq/L 3.8   Chloride 96 - 112 mEq/L 101   CO2 19 - 32 mEq/L 24   Calcium 8.4 - 10.5 mg/dL 9.6   Total Protein 6.0 - 8.3 g/dL 7.5   Total Bilirubin 0.3 - 1.2 mg/dL <3.4   Alkaline Phos 39 - 117 U/L 72   AST 0 - 37 U/L 23   ALT 0 - 35 U/L 13       Latest Ref Rng & Units 05/25/2014    1:18 PM  CBC  WBC 4.0 - 10.5 K/uL 5.2   Hemoglobin 12.0 - 15.0 g/dL 19.6   Hematocrit 22.2 - 46.0 % 38.4   Platelets 150 - 400 K/uL 285     Lipid Panel No results for input(s): "CHOL", "TRIG", "LDLCALC", "VLDL", "HDL", "CHOLHDL", "LDLDIRECT" in the last 8760 hours.  HEMOGLOBIN A1C No results found for: "HGBA1C", "MPG" TSH No results for input(s): "TSH" in the last 8760 hours.  External labs:     Radiology:    Cardiac Studies:   No results found for this or any previous visit from the past 1095 days.     No results found for this or any previous visit from the past 1095 days.     EKG:     07/03/22 - Sinus Rhythm with poor R-wave progression  07/03/22 REPEAT EKG DUE TO SEVERE REACTION TO SUBLINGUAL NITRO -  Sinus Rhythm with poor R-wave progression  Assessment  ICD-10-CM   1. Precordial  pain  R07.2 EKG 12-Lead    PCV ECHOCARDIOGRAM COMPLETE    PCV MYOCARDIAL PERFUSION WO LEXISCAN    aspirin chewable tablet 81 mg    nitroGLYCERIN (NITROSTAT) SL tablet 0.4 mg    2. Heart murmur  R01.1 PCV ECHOCARDIOGRAM COMPLETE    PCV MYOCARDIAL PERFUSION WO LEXISCAN    aspirin chewable tablet 81 mg    nitroGLYCERIN (NITROSTAT) SL tablet 0.4 mg    3. Essential hypertension  I10 PCV ECHOCARDIOGRAM COMPLETE    PCV MYOCARDIAL PERFUSION WO LEXISCAN    aspirin chewable tablet 81 mg    nitroGLYCERIN (NITROSTAT) SL tablet 0.4 mg    4. Hyperlipidemia LDL goal <70  E78.5 PCV ECHOCARDIOGRAM COMPLETE    PCV MYOCARDIAL PERFUSION WO LEXISCAN    aspirin chewable tablet 81 mg    nitroGLYCERIN (NITROSTAT) SL tablet 0.4 mg    5. Adverse reaction to nitroglycerin  T46.3X5A        Orders Placed This Encounter  Procedures   PCV MYOCARDIAL PERFUSION WO LEXISCAN    Standing Status:   Future    Standing Expiration Date:   09/02/2022   EKG 12-Lead   PCV ECHOCARDIOGRAM COMPLETE    Standing Status:   Future    Standing Expiration Date:   07/04/2023    Meds ordered this encounter  Medications   rosuvastatin (CRESTOR) 5 MG tablet    Sig: Take 1 tablet (5 mg total) by mouth at bedtime.    Dispense:  30 tablet    Refill:  2   aspirin chewable tablet 81 mg   nitroGLYCERIN (NITROSTAT) SL tablet 0.4 mg    There are no discontinued medications.   Recommendations:   Alvilda Mckenna is a 88 y.o.  F with chest pain on exertion and hypertension  Will initiate baby aspirin daily Crestor 5mg  qHS sent to pharmacy EKG repeated after profound reaction to sublingual nitro given for hypertensive urgency Will not initiate blood pressure meds at this time considering her reaction to one 0.4mg  sublingual nitro Repeat EKG unchanged, blood pressure improved to 154/90 Encourage low-sodium diet, less than 2000 mg daily. Schedule imaging tests in office - echo and stress test. Follow-up in 1-2 months or sooner if  needed.    I spent at least 74 minutes coordinating this patient's care.   , DO, [facility]  07/03/2022, 11:52 AM Office: [phone] Pager: [phone]

## 2022-07-25 ENCOUNTER — Ambulatory Visit: Payer: Medicare Other

## 2022-07-25 DIAGNOSIS — E785 Hyperlipidemia, unspecified: Secondary | ICD-10-CM | POA: Diagnosis not present

## 2022-07-25 DIAGNOSIS — R072 Precordial pain: Secondary | ICD-10-CM | POA: Diagnosis not present

## 2022-07-25 DIAGNOSIS — R011 Cardiac murmur, unspecified: Secondary | ICD-10-CM | POA: Diagnosis not present

## 2022-07-25 DIAGNOSIS — I10 Essential (primary) hypertension: Secondary | ICD-10-CM

## 2022-08-03 ENCOUNTER — Ambulatory Visit: Payer: Medicare Other

## 2022-08-03 DIAGNOSIS — E785 Hyperlipidemia, unspecified: Secondary | ICD-10-CM

## 2022-08-03 DIAGNOSIS — R072 Precordial pain: Secondary | ICD-10-CM

## 2022-08-03 DIAGNOSIS — I10 Essential (primary) hypertension: Secondary | ICD-10-CM | POA: Diagnosis not present

## 2022-08-03 DIAGNOSIS — R011 Cardiac murmur, unspecified: Secondary | ICD-10-CM | POA: Diagnosis not present

## 2022-08-20 ENCOUNTER — Ambulatory Visit: Payer: Medicare Other | Admitting: Internal Medicine

## 2022-08-21 ENCOUNTER — Encounter: Payer: Self-pay | Admitting: Internal Medicine

## 2022-08-21 ENCOUNTER — Ambulatory Visit: Payer: Medicare Other | Admitting: Internal Medicine

## 2022-08-21 VITALS — BP 176/95 | HR 86 | Temp 98.1°F | Resp 16 | Ht 61.0 in | Wt 97.4 lb

## 2022-08-21 DIAGNOSIS — I5189 Other ill-defined heart diseases: Secondary | ICD-10-CM | POA: Insufficient documentation

## 2022-08-21 DIAGNOSIS — E785 Hyperlipidemia, unspecified: Secondary | ICD-10-CM | POA: Diagnosis not present

## 2022-08-21 DIAGNOSIS — I10 Essential (primary) hypertension: Secondary | ICD-10-CM | POA: Diagnosis not present

## 2022-08-21 NOTE — Progress Notes (Signed)
Primary Physician/Referring:  Deland Pretty, MD  Patient ID: Samantha Burns, female    DOB: 03-Apr-1933, 88 y.o.   MRN: [id number]  No chief complaint on file.  HPI:    Samantha Burns  is a 88 y.o. female with no reported past cardiac history who is here for follow-up visit.  Patient has been doing well since our last visit.  She just got back from a hiking trip.  She has been working in her garden and around the house without any issues whatsoever.  Patient completed stress test and echocardiogram.  Her stress test was negative for ischemia.  She does have some diastolic dysfunction on her echo however she does not have any heart failure symptoms and this does not limit her or her activity.  Patient denies chest pain, shortness of breath, palpitations, diaphoresis, syncope, orthopnea, edema, PND.  She is hesitant to take any medications especially blood pressure medication considering the reaction she had to sublingual nitro.  Patient is very sensitive to changes in her blood pressure.  After discussion with her, will forego starting any blood pressure medications.  Past Medical History:  Diagnosis Date   Heart murmur 04/1944   History reviewed. No pertinent surgical history. Family History  Problem Relation Age of Onset   Heart disease Brother     Social History   Tobacco Use   Smoking status: Never   Smokeless tobacco: Never  Substance Use Topics   Alcohol use: Not Currently   Marital Status: Single  ROS  Review of Systems  Cardiovascular:  Negative for chest pain, dyspnea on exertion, near-syncope and palpitations.  All other systems reviewed and are negative.  Objective  Blood pressure (!) 176/95, pulse 86, temperature 98.1 F (36.7 C), temperature source Temporal, resp. rate 16, height 5\' 1"  (1.549 m), weight 97 lb 6.4 oz (44.2 kg), SpO2 98 %. Body mass index is 18.4 kg/m.     08/21/2022    9:34 AM 07/03/2022    9:28 AM 07/17/2017    7:46 AM  Vitals with BMI  Height 5'  1" 5\' 1"    Weight 97 lbs 6 oz 95 lbs   BMI [id number]   Systolic 0000000  [id number]  Diastolic 95  78  Pulse 86  87     Physical Exam Vitals and nursing note reviewed.  Constitutional:      General: She is not in acute distress.    Appearance: Normal appearance.  HENT:     Head: Normocephalic and atraumatic.  Neck:     Vascular: No carotid bruit.  Cardiovascular:     Rate and Rhythm: Normal rate and regular rhythm.     Pulses: Normal pulses.     Heart sounds: Murmur heard.     No friction rub. No gallop.  Pulmonary:     Effort: Pulmonary effort is normal.     Breath sounds: Normal breath sounds.  Abdominal:     General: Abdomen is flat. Bowel sounds are normal.     Palpations: Abdomen is soft.  Musculoskeletal:     Right lower leg: No edema.     Left lower leg: No edema.  Skin:    General: Skin is warm and dry.  Neurological:     Mental Status: She is alert.    Medications and allergies   Allergies  Allergen Reactions   Other     Reports all anesthesia causes n/v     Medication list after today's encounter   Current Outpatient Medications:  10894, TAKE ONE CAPSULE BY MOUTH 30 MINUTES prior TO morning meal, Disp: , Rfl:    acetaminophen (TYLENOL) 500 MG tablet, Take 1 tablet (500 mg total) by mouth every 6 (six) hours as needed., Disp: 30 tablet, Rfl: 0   ketoconazole (NIZORAL) 2 % shampoo, USE as directed Externally Twice per week, Disp: , Rfl:    KETOCONAZOLE, TOPICAL, 1 % SHAM, APPLY SHAMPOO BY TOPICAL ROUTE EVERY 3 DAYS LATHER, RINSE, AND REPEAT, Disp: , Rfl:    Naproxen (NAPROSYN PO), as needed., Disp: , Rfl:    predniSONE (DELTASONE) 50 MG tablet, Take 1 tablet (50 mg total) by mouth daily., Disp: 7 tablet, Rfl: 0   rosuvastatin (CRESTOR) 5 MG tablet, Take 1 tablet (5 mg total) by mouth at bedtime., Disp: 30 tablet, Rfl: 2  Current Facility-Administered Medications:    aspirin chewable tablet 81 mg, 81 mg, Oral, Daily, Jonathan Corpus, DO   nitroGLYCERIN  (NITROSTAT) SL tablet 0.4 mg, 0.4 mg, Sublingual, Q5 min PRN, Estil Vallee, DO  Laboratory examination:   Lab Results  Component Value Date   NA 141 05/25/2014   K 3.8 05/25/2014   CO2 24 05/25/2014   GLUCOSE 100 (H) 05/25/2014   BUN 9 05/25/2014   CREATININE 0.58 05/25/2014   CALCIUM 9.6 05/25/2014   GFRNONAA 86 (L) 05/25/2014       Latest Ref Rng & Units 05/25/2014    1:18 PM  CMP  Glucose 70 - 99 mg/dL 100   BUN 6 - 23 mg/dL 9   Creatinine 0.50 - 1.10 mg/dL 0.58   Sodium 137 - 147 mEq/L 141   Potassium 3.7 - 5.3 mEq/L 3.8   Chloride 96 - 112 mEq/L 101   CO2 19 - 32 mEq/L 24   Calcium 8.4 - 10.5 mg/dL 9.6   Total Protein 6.0 - 8.3 g/dL 7.5   Total Bilirubin 0.3 - 1.2 mg/dL <0.2   Alkaline Phos 39 - 117 U/L 72   AST 0 - 37 U/L 23   ALT 0 - 35 U/L 13       Latest Ref Rng & Units 05/25/2014    1:18 PM  CBC  WBC 4.0 - 10.5 K/uL 5.2   Hemoglobin 12.0 - 15.0 g/dL 12.9   Hematocrit 36.0 - 46.0 % 38.4   Platelets 150 - 400 K/uL 285     Lipid Panel No results for input(s): "CHOL", "TRIG", "LDLCALC", "VLDL", "HDL", "CHOLHDL", "LDLDIRECT" in the last 8760 hours.  HEMOGLOBIN A1C No results found for: "HGBA1C", "MPG" TSH No results for input(s): "TSH" in the last 8760 hours.  External labs:     Radiology:    Cardiac Studies:   Exercise Tetrofosmin stress test 07/25/2022: Exercise nuclear stress test was performed using Bruce protocol. Patient reached 3 METS, and 100% of age predicted maximum heart rate. Exercise capacity was very low. No chest pain reported. Resting hypertension BP 160/90 mmHg with normal heart rate and hemodynamic response. Stress EKG revealed no ischemic changes. Frequent PVCs were noted on stress EKG.  Normal myocardial perfusion. Stress LVEF 64%. Low risk study. Recommend clinical correlation due to very low exercise capacity.    Echocardiogram 08/03/22  Normal LV systolic function with visual EF 60-65%. Left ventricle cavity  is normal  in size. Mild concentric hypertrophy of the left ventricle.  Normal global wall motion. Doppler evidence of grade II (pseudonormal)  diastolic dysfunction, elevated LAP. Calculated EF 67%.  Trileaflet aortic valve with no regurgitation. Mild aortic valve leaflet  calcification.  Trace mitral regurgitation. Mild mitral valve leaflet thickening with mild  calcification.  Structurally normal tricuspid valve with trace regurgitation. No evidence  of pulmonary hypertension.  no prior available for comparison.      EKG:     07/03/22 - Sinus Rhythm with poor R-wave progression  07/03/22 REPEAT EKG DUE TO SEVERE REACTION TO SUBLINGUAL NITRO -  Sinus Rhythm with poor R-wave progression  Assessment     ICD-10-CM   1. Essential hypertension  I10     2. Hyperlipidemia LDL goal <70  E78.5     3. Grade II diastolic dysfunction  H74.16        No orders of the defined types were placed in this encounter.   No orders of the defined types were placed in this encounter.   There are no discontinued medications.   Recommendations:   Bertrice Leder is a 88 y.o.  F with hypertension  Essential hypertension Will not start any BP meds as pt has severe reaction to lower pressures  Hyperlipidemia LDL goal <70 Continue Crestor  Grade II diastolic dysfunction Encourage low-sodium diet, less than 2000 mg daily. Repeat echo if symptoms change Follow-up in 6 months or sooner if needed.      Floydene Flock, DO, [facility]  08/21/2022, 10:22 AM Office: [phone] Pager: [phone]

## 2022-10-03 DIAGNOSIS — B351 Tinea unguium: Secondary | ICD-10-CM | POA: Diagnosis not present

## 2022-10-19 ENCOUNTER — Other Ambulatory Visit: Payer: Self-pay | Admitting: Internal Medicine

## 2022-11-19 DIAGNOSIS — R636 Underweight: Secondary | ICD-10-CM | POA: Diagnosis not present

## 2022-11-19 DIAGNOSIS — Z0000 Encounter for general adult medical examination without abnormal findings: Secondary | ICD-10-CM | POA: Diagnosis not present

## 2022-11-19 DIAGNOSIS — E559 Vitamin D deficiency, unspecified: Secondary | ICD-10-CM | POA: Diagnosis not present

## 2022-11-26 DIAGNOSIS — K219 Gastro-esophageal reflux disease without esophagitis: Secondary | ICD-10-CM | POA: Diagnosis not present

## 2022-11-26 DIAGNOSIS — E559 Vitamin D deficiency, unspecified: Secondary | ICD-10-CM | POA: Diagnosis not present

## 2022-11-26 DIAGNOSIS — N3281 Overactive bladder: Secondary | ICD-10-CM | POA: Diagnosis not present

## 2022-11-26 DIAGNOSIS — E7800 Pure hypercholesterolemia, unspecified: Secondary | ICD-10-CM | POA: Diagnosis not present

## 2022-11-26 DIAGNOSIS — Z0000 Encounter for general adult medical examination without abnormal findings: Secondary | ICD-10-CM | POA: Diagnosis not present

## 2022-11-27 ENCOUNTER — Encounter: Payer: Self-pay | Admitting: Internal Medicine

## 2022-12-05 DIAGNOSIS — K219 Gastro-esophageal reflux disease without esophagitis: Secondary | ICD-10-CM | POA: Diagnosis not present

## 2022-12-05 DIAGNOSIS — R140 Abdominal distension (gaseous): Secondary | ICD-10-CM | POA: Diagnosis not present

## 2022-12-05 DIAGNOSIS — R152 Fecal urgency: Secondary | ICD-10-CM | POA: Diagnosis not present

## 2022-12-05 DIAGNOSIS — R1310 Dysphagia, unspecified: Secondary | ICD-10-CM | POA: Diagnosis not present

## 2022-12-25 DIAGNOSIS — R1310 Dysphagia, unspecified: Secondary | ICD-10-CM | POA: Diagnosis not present

## 2022-12-25 DIAGNOSIS — K449 Diaphragmatic hernia without obstruction or gangrene: Secondary | ICD-10-CM | POA: Diagnosis not present

## 2022-12-25 DIAGNOSIS — K2100 Gastro-esophageal reflux disease with esophagitis, without bleeding: Secondary | ICD-10-CM | POA: Diagnosis not present

## 2023-01-02 DIAGNOSIS — K449 Diaphragmatic hernia without obstruction or gangrene: Secondary | ICD-10-CM | POA: Diagnosis not present

## 2023-01-02 DIAGNOSIS — K2100 Gastro-esophageal reflux disease with esophagitis, without bleeding: Secondary | ICD-10-CM | POA: Diagnosis not present

## 2023-01-02 DIAGNOSIS — B351 Tinea unguium: Secondary | ICD-10-CM | POA: Diagnosis not present

## 2023-02-20 ENCOUNTER — Ambulatory Visit: Payer: Self-pay | Admitting: Internal Medicine

## 2023-04-08 DIAGNOSIS — B351 Tinea unguium: Secondary | ICD-10-CM | POA: Diagnosis not present

## 2023-05-22 DIAGNOSIS — R319 Hematuria, unspecified: Secondary | ICD-10-CM | POA: Diagnosis not present

## 2023-05-22 DIAGNOSIS — E7800 Pure hypercholesterolemia, unspecified: Secondary | ICD-10-CM | POA: Diagnosis not present

## 2023-05-22 DIAGNOSIS — E559 Vitamin D deficiency, unspecified: Secondary | ICD-10-CM | POA: Diagnosis not present

## 2023-05-27 DIAGNOSIS — R3129 Other microscopic hematuria: Secondary | ICD-10-CM | POA: Diagnosis not present

## 2023-05-27 DIAGNOSIS — K219 Gastro-esophageal reflux disease without esophagitis: Secondary | ICD-10-CM | POA: Diagnosis not present

## 2023-05-27 DIAGNOSIS — E559 Vitamin D deficiency, unspecified: Secondary | ICD-10-CM | POA: Diagnosis not present

## 2023-07-03 DIAGNOSIS — K2100 Gastro-esophageal reflux disease with esophagitis, without bleeding: Secondary | ICD-10-CM | POA: Diagnosis not present

## 2023-07-03 DIAGNOSIS — R140 Abdominal distension (gaseous): Secondary | ICD-10-CM | POA: Diagnosis not present

## 2023-07-09 DIAGNOSIS — B351 Tinea unguium: Secondary | ICD-10-CM | POA: Diagnosis not present

## 2023-07-15 DIAGNOSIS — R311 Benign essential microscopic hematuria: Secondary | ICD-10-CM | POA: Diagnosis not present

## 2023-07-15 DIAGNOSIS — N3941 Urge incontinence: Secondary | ICD-10-CM | POA: Diagnosis not present

## 2023-07-15 DIAGNOSIS — R319 Hematuria, unspecified: Secondary | ICD-10-CM | POA: Diagnosis not present

## 2023-08-05 DIAGNOSIS — R311 Benign essential microscopic hematuria: Secondary | ICD-10-CM | POA: Diagnosis not present

## 2023-09-04 DIAGNOSIS — R252 Cramp and spasm: Secondary | ICD-10-CM | POA: Diagnosis not present

## 2023-09-12 DIAGNOSIS — N3941 Urge incontinence: Secondary | ICD-10-CM | POA: Diagnosis not present

## 2023-09-12 DIAGNOSIS — R311 Benign essential microscopic hematuria: Secondary | ICD-10-CM | POA: Diagnosis not present

## 2023-10-08 DIAGNOSIS — K5900 Constipation, unspecified: Secondary | ICD-10-CM | POA: Diagnosis not present

## 2023-10-08 DIAGNOSIS — N3946 Mixed incontinence: Secondary | ICD-10-CM | POA: Diagnosis not present

## 2023-10-08 DIAGNOSIS — M6289 Other specified disorders of muscle: Secondary | ICD-10-CM | POA: Diagnosis not present

## 2023-10-08 DIAGNOSIS — M6281 Muscle weakness (generalized): Secondary | ICD-10-CM | POA: Diagnosis not present

## 2023-10-09 DIAGNOSIS — B351 Tinea unguium: Secondary | ICD-10-CM | POA: Diagnosis not present

## 2023-11-12 ENCOUNTER — Ambulatory Visit: Payer: Self-pay | Admitting: Cardiovascular Disease

## 2023-11-13 DIAGNOSIS — E559 Vitamin D deficiency, unspecified: Secondary | ICD-10-CM | POA: Diagnosis not present

## 2023-11-13 DIAGNOSIS — J029 Acute pharyngitis, unspecified: Secondary | ICD-10-CM | POA: Diagnosis not present

## 2023-11-13 DIAGNOSIS — K219 Gastro-esophageal reflux disease without esophagitis: Secondary | ICD-10-CM | POA: Diagnosis not present

## 2023-11-13 DIAGNOSIS — J069 Acute upper respiratory infection, unspecified: Secondary | ICD-10-CM | POA: Diagnosis not present

## 2023-11-22 DIAGNOSIS — Z0000 Encounter for general adult medical examination without abnormal findings: Secondary | ICD-10-CM | POA: Diagnosis not present

## 2023-11-22 DIAGNOSIS — I5189 Other ill-defined heart diseases: Secondary | ICD-10-CM | POA: Diagnosis not present

## 2023-11-22 DIAGNOSIS — E7800 Pure hypercholesterolemia, unspecified: Secondary | ICD-10-CM | POA: Diagnosis not present

## 2023-11-22 DIAGNOSIS — R5383 Other fatigue: Secondary | ICD-10-CM | POA: Diagnosis not present

## 2023-11-22 DIAGNOSIS — E559 Vitamin D deficiency, unspecified: Secondary | ICD-10-CM | POA: Diagnosis not present

## 2023-11-29 DIAGNOSIS — J029 Acute pharyngitis, unspecified: Secondary | ICD-10-CM | POA: Diagnosis not present

## 2023-12-02 DIAGNOSIS — E559 Vitamin D deficiency, unspecified: Secondary | ICD-10-CM | POA: Diagnosis not present

## 2023-12-02 DIAGNOSIS — E7800 Pure hypercholesterolemia, unspecified: Secondary | ICD-10-CM | POA: Diagnosis not present

## 2023-12-02 DIAGNOSIS — Z0000 Encounter for general adult medical examination without abnormal findings: Secondary | ICD-10-CM | POA: Diagnosis not present

## 2023-12-02 DIAGNOSIS — M810 Age-related osteoporosis without current pathological fracture: Secondary | ICD-10-CM | POA: Diagnosis not present

## 2023-12-02 DIAGNOSIS — K219 Gastro-esophageal reflux disease without esophagitis: Secondary | ICD-10-CM | POA: Diagnosis not present

## 2023-12-04 ENCOUNTER — Encounter: Payer: Self-pay | Admitting: Cardiology

## 2023-12-04 ENCOUNTER — Ambulatory Visit: Payer: Medicare Other | Attending: Cardiology | Admitting: Cardiology

## 2023-12-04 VITALS — BP 162/90 | HR 91 | Ht 62.0 in | Wt 106.4 lb

## 2023-12-04 DIAGNOSIS — I10 Essential (primary) hypertension: Secondary | ICD-10-CM

## 2023-12-04 DIAGNOSIS — I7143 Infrarenal abdominal aortic aneurysm, without rupture: Secondary | ICD-10-CM | POA: Diagnosis not present

## 2023-12-04 DIAGNOSIS — E785 Hyperlipidemia, unspecified: Secondary | ICD-10-CM | POA: Diagnosis not present

## 2023-12-04 NOTE — Patient Instructions (Addendum)
Medication Instructions:  Your physician recommends that you continue on your current medications as directed. Please refer to the Current Medication list given to you today.  *If you need a refill on your cardiac medications before your next appointment, please call your pharmacy*  Follow-Up: At [facility], you and your health needs are our priority.  As part of our continuing mission to provide you with exceptional heart care, we have created designated Provider Care Teams.  These Care Teams include your primary Cardiologist (physician) and Advanced Practice Providers (APPs -  Physician Assistants and Nurse Practitioners) who all work together to provide you with the care you need, when you need it.   Your next appointment:   9 month(s)  Provider:   Thomasene Ripple, DO   Other instructions:

## 2023-12-07 NOTE — Progress Notes (Signed)
Cardiology Office Note:    Date:  12/07/2023   ID:  Samantha Burns, DOB 1932-12-19, MRN [id number]  PCP:  Merri Brunette, MD  Cardiologist:  None  Electrophysiologist:  None   Referring MD: Shelly Rubenstein*   " I was told I have an aneurysm"  History of Present Illness:    Samantha Burns is a 89 y.o. 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  In November 2024.  The scan was initially performed for urological issues related to age, described as being at the 'leaky stage.' The patient reports being very active, engaging in yard work, horse riding, and adventure travel. They deny any symptoms of chest pain, shortness of breath, or other cardiac symptoms.  The patient also reports a history of difficulty swallowing, which has improved significantly after esophageal dilation performed by Dr. Elnoria Howard. They take a small pill 20 minutes before breakfast to aid in swallowing. They have also been working with a physical therapist, have given up carbonation and caffeine, and increased water intake to manage their symptoms.  The patient has a known heart murmur, described as a 'whisper,' which has been present since childhood. They report no issues related to the murmur and maintain an active lifestyle. The patient's mother lived to 65 and died from a ruptured aorta, which raises concern for a similar issue in the patient.  The patient also reports a recent issue with their foot, which appears to be related to venous insufficiency. They deny any pain or discomfort related to this issue.  Past Medical History:  Diagnosis Date   Heart murmur 04/1944    No past surgical history on file.  Current Medications: Current Meds  Medication Sig   10894 TAKE ONE CAPSULE BY MOUTH 30 MINUTES prior TO morning meal   acetaminophen (TYLENOL) 500 MG tablet  Take 1 tablet (500 mg total) by mouth every 6 (six) hours as needed.   ketoconazole (NIZORAL) 2 % shampoo USE as directed Externally Twice per week   KETOCONAZOLE, TOPICAL, 1 % SHAM APPLY SHAMPOO BY TOPICAL ROUTE EVERY 3 DAYS LATHER, RINSE, AND REPEAT   Naproxen (NAPROSYN PO) as needed.   predniSONE (DELTASONE) 50 MG tablet Take 1 tablet (50 mg total) by mouth daily.   Current Facility-Administered Medications for the 12/04/23 encounter (Office Visit) with Thomasene Ripple, DO  Medication   aspirin chewable tablet 81 mg   nitroGLYCERIN (NITROSTAT) SL tablet 0.4 mg     Allergies:   Other   Social History   Socioeconomic History   Marital status: Single    Spouse name: Not on file   Number of children: Not on file   Years of education: Not on file   Highest education level: Not on file  Occupational History   Not on file  Tobacco Use   Smoking status: Never   Smokeless tobacco: Never  Vaping Use   Vaping status: Never Used  Substance and Sexual Activity   Alcohol use: Not Currently   Drug use: No   Sexual activity: Not on file  Other Topics Concern   Not on file  Social History Narrative   Not on file   Social Drivers of Health   Financial Resource Strain: Not on file  Food Insecurity: Not on file  Transportation Needs: Not on file  Physical Activity: Not on file  Stress: Not on file  Social Connections: Not on file     Family History: The patient's family history includes Heart disease in her brother.  ROS:   Review of Systems  Constitution: Negative for decreased appetite, fever and weight gain.  HENT: Negative for congestion, ear discharge, hoarse voice and sore throat.   Eyes: Negative for discharge, redness, vision loss in right eye and visual halos.  Cardiovascular: Negative for chest pain, dyspnea on exertion, leg swelling, orthopnea and palpitations.  Respiratory: Negative for cough, hemoptysis, shortness of breath and snoring.   Endocrine: Negative for heat  intolerance and polyphagia.  Hematologic/Lymphatic: Negative for bleeding problem. Does not bruise/bleed easily.  Skin: Negative for flushing, nail changes, rash and suspicious lesions.  Musculoskeletal: Negative for arthritis, joint pain, muscle cramps, myalgias, neck pain and stiffness.  Gastrointestinal: Negative for abdominal pain, bowel incontinence, diarrhea and excessive appetite.  Genitourinary: Negative for decreased libido, genital sores and incomplete emptying.  Neurological: Negative for brief paralysis, focal weakness, headaches and loss of balance.  Psychiatric/Behavioral: Negative for altered mental status, depression and suicidal ideas.  Allergic/Immunologic: Negative for HIV exposure and persistent infections.    EKGs/Labs/Other Studies Reviewed:    The following studies were reviewed today:   EKG:  The ekg ordered today demonstrates   Recent Labs: No results found for requested labs within last 365 days.  Recent Lipid Panel No results found for: "CHOL", "TRIG", "HDL", "CHOLHDL", "VLDL", "LDLCALC", "LDLDIRECT"  Physical Exam:    VS:  BP (!) 162/90 (BP Location: Right Arm, Patient Position: Sitting, Cuff Size: Normal)   Pulse 91   Ht 5\' 2"  (1.575 m)   Wt 106 lb 6.4 oz (48.3 kg)   SpO2 93%   BMI 19.46 kg/m     Wt Readings from Last 3 Encounters:  12/04/23 106 lb 6.4 oz (48.3 kg)  08/21/22 97 lb 6.4 oz (44.2 kg)  07/03/22 95 lb (43.1 kg)     GEN: Well nourished, well developed in no acute distress HEENT: Normal NECK: No JVD; No carotid bruits LYMPHATICS: No lymphadenopathy CARDIAC: S1S2 noted,RRR, no murmurs, rubs, gallops RESPIRATORY:  Clear to auscultation without rales, wheezing or rhonchi  ABDOMEN: Soft, non-tender, non-distended, +bowel sounds, no guarding. EXTREMITIES: No edema, No cyanosis, no clubbing MUSCULOSKELETAL:  No deformity  SKIN: Warm and dry NEUROLOGIC:  Alert and oriented x 3, non-focal PSYCHIATRIC:  Normal affect, good  insight  ASSESSMENT:    1. Essential hypertension   2. Infrarenal abdominal aortic aneurysm (AAA) without rupture (HCC)   3. Hyperlipidemia LDL goal <70    PLAN:    Infrarenal aneurysm - will refer the patient to vascular surgery.   HTN - blood pressure elevated but she prefers not to be on medication.   HLN - on statin   The patient is in agreement with the above plan. The patient left the office in stable condition.  The patient will follow up in   Medication Adjustments/Labs and Tests Ordered: Current medicines are reviewed at length with the patient today.  Concerns regarding medicines are outlined above.  Orders Placed This Encounter  Procedures   EKG 12-Lead   No orders of the defined types were placed in this encounter.   Patient Instructions  Medication Instructions:  Your physician recommends that you continue on your current medications as directed. Please refer to the Current Medication list given to you today.  *If you need a refill on your cardiac medications before your next appointment, please call your pharmacy*  Follow-Up: At [facility],  you and your health needs are our priority.  As part of our continuing mission to provide you with exceptional heart care, we have created designated Provider Care Teams.  These Care Teams include your primary Cardiologist (physician) and Advanced Practice Providers (APPs -  Physician Assistants and Nurse Practitioners) who all work together to provide you with the care you need, when you need it.   Your next appointment:   9 month(s)  Provider:   Thomasene Ripple, DO   Other instructions:     Adopting a Healthy Lifestyle.  Know what a healthy weight is for you (roughly BMI <25) and aim to maintain this   Aim for 7+ servings of fruits and vegetables daily   65-80+ fluid ounces of water or unsweet tea for healthy kidneys   Limit to max 1 drink of alcohol per day; avoid smoking/tobacco   Limit animal fats  in diet for cholesterol and heart health - choose grass fed whenever available   Avoid highly processed foods, and foods high in saturated/trans fats   Aim for low stress - take time to unwind and care for your mental health   Aim for 150 min of moderate intensity exercise weekly for heart health, and weights twice weekly for bone health   Aim for 7-9 hours of sleep daily   When it comes to diets, agreement about the perfect plan isnt easy to find, even among the experts. Experts at the [facility] [employer] developed an idea known as the Healthy Eating Plate. Just imagine a plate divided into logical, healthy portions.   The emphasis is on diet quality:   Load up on vegetables and fruits - one-half of your plate: Aim for color and variety, and remember that potatoes dont count.   Go for whole grains - one-quarter of your plate: Whole wheat, barley, wheat berries, quinoa, oats, brown rice, and foods made with them. If you want pasta, go with whole wheat pasta.   Protein power - one-quarter of your plate: Fish, chicken, beans, and nuts are all healthy, versatile protein sources. Limit red meat.   The diet, however, does go beyond the plate, offering a few other suggestions.   Use healthy plant oils, such as olive, canola, soy, corn, sunflower and peanut. Check the labels, and avoid partially hydrogenated oil, which have unhealthy trans fats.   If youre thirsty, drink water. Coffee and tea are good in moderation, but skip sugary drinks and limit milk and dairy products to one or two daily servings.   The type of carbohydrate in the diet is more important than the amount. Some sources of carbohydrates, such as vegetables, fruits, whole grains, and beans-are healthier than others.   Finally, stay active  Signed, Thomasene Ripple, DO  12/07/2023 7:46 AM    [facility]

## 2023-12-09 DIAGNOSIS — N3946 Mixed incontinence: Secondary | ICD-10-CM | POA: Diagnosis not present

## 2023-12-09 DIAGNOSIS — M6289 Other specified disorders of muscle: Secondary | ICD-10-CM | POA: Diagnosis not present

## 2023-12-09 DIAGNOSIS — M6281 Muscle weakness (generalized): Secondary | ICD-10-CM | POA: Diagnosis not present

## 2023-12-09 DIAGNOSIS — M62838 Other muscle spasm: Secondary | ICD-10-CM | POA: Diagnosis not present

## 2023-12-10 DIAGNOSIS — N952 Postmenopausal atrophic vaginitis: Secondary | ICD-10-CM | POA: Diagnosis not present

## 2023-12-18 ENCOUNTER — Telehealth: Payer: Self-pay | Admitting: Cardiology

## 2023-12-18 NOTE — Telephone Encounter (Signed)
Pt called in asking if she can get a further explanation on why she needs [facility] referral. Please advise.

## 2023-12-19 NOTE — Telephone Encounter (Signed)
Patient called to inform Dr. Servando Salina she has been scheduled for AAA Duplex + follow-up with Dr. Lenell Antu [facility] on 4/01--FYI.

## 2024-01-13 ENCOUNTER — Ambulatory Visit: Payer: Medicare Other | Admitting: Cardiology

## 2024-01-16 DIAGNOSIS — M6281 Muscle weakness (generalized): Secondary | ICD-10-CM | POA: Diagnosis not present

## 2024-01-16 DIAGNOSIS — N3946 Mixed incontinence: Secondary | ICD-10-CM | POA: Diagnosis not present

## 2024-01-16 DIAGNOSIS — M6289 Other specified disorders of muscle: Secondary | ICD-10-CM | POA: Diagnosis not present

## 2024-01-16 DIAGNOSIS — M62838 Other muscle spasm: Secondary | ICD-10-CM | POA: Diagnosis not present

## 2024-01-22 DIAGNOSIS — B351 Tinea unguium: Secondary | ICD-10-CM | POA: Diagnosis not present

## 2024-01-24 ENCOUNTER — Other Ambulatory Visit: Payer: Self-pay

## 2024-01-24 DIAGNOSIS — I7143 Infrarenal abdominal aortic aneurysm, without rupture: Secondary | ICD-10-CM

## 2024-02-03 NOTE — Progress Notes (Unsigned)
 [facility] [place]  ASSESSMENT / PLAN: Samantha Burns is a 89 y.o. 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  The Joint Council of the American Association for Vascular Surgery and Society for Vascular Surgery estimates annual rupture risk based on abdominal aneurysm diameter. The patient's estimated risk is {aneurysmrupture:24841}  The patient is *** a candidate for elective repair of the aneurysm to prevent rupture.  I explained the risks / benefits / alternatives to different approaches to aortic reconstruction.   I explained the specific benefits of open repair including improved durability, limited requirement for surveillance, less need for secondary intervention. I explained the specific risks from open repair including higher physiologic stress from aortic cross clamping, higher risk of perioperative complication (including stroke, MI, pneumonia, renal insufficiency and failure, etc.), higher risk of abdominal wall complications, risk of anastomotic pseudoaneurysm.  I explained the specific benefits of endovascular repair including limited physiologic stress and less recovery time, lower risk of serious perioperative complication, zero risk of abdominal wall complication.  I explained the specific risks from endovascular repair including need for lifetime surveillance, risk of large-bore arterial access, risk of requiring secondary intervention to maintain seal or patency. I explained that not all patients are candidates for endovascular repair based on their unique anatomy.  After detailed discussion the patient and I agree that the best option for the patient is ***.   Recommend:  Abstinence from all tobacco products. Blood glucose control with goal A1c < 7%. Blood pressure control with goal blood pressure < 140/90 mmHg. Lipid reduction therapy with goal LDL-C <100 mg/dL.  Aspirin 81mg  PO QD.  *** Clopidogrel 75mg  PO QD. *** Rivaroxaban 2.5mg  PO  BID. *** Cilostozal 100mg  PO BID for intermittent claudication without evidence of heart failure. Atorvastatin 40-80mg  PO QD (or other "high intensity" statin therapy).    CHIEF COMPLAINT: ***  HISTORY OF PRESENT ILLNESS: Samantha Burns is a 89 y.o. female ***  VASCULAR SURGICAL HISTORY: ***  VASCULAR RISK FACTORS: {FINDINGS; POSITIVE NEGATIVE:[id number]} history of stroke / transient ischemic attack. {FINDINGS; POSITIVE NEGATIVE:[id number]} history of coronary artery disease. *** history of PCI. *** history of CABG.  {FINDINGS; POSITIVE NEGATIVE:[id number]} history of diabetes mellitus. Last A1c ***. {FINDINGS; POSITIVE NEGATIVE:[id number]} history of smoking. *** actively smoking. {FINDINGS; POSITIVE NEGATIVE:[id number]} history of hypertension. *** drug regimen with *** control. {FINDINGS; POSITIVE NEGATIVE:[id number]} history of chronic kidney disease.  Last GFR ***. CKD {stage:30421363}. {FINDINGS; POSITIVE NEGATIVE:[id number]} history of chronic obstructive pulmonary disease, treated with ***.  FUNCTIONAL STATUS: ECOG performance status: {findings; ecog performance status:31780} Ambulatory status: {TNHAmbulation:25868}  CAREY 1 AND 3 YEAR INDEX Female (2pts) 75-79 or 80-84 (2pts) >84 (3pts) Dependence in toileting (1pt) Partial or full dependence in dressing (1pt) History of malignant neoplasm (2pts) CHF (3pts) COPD (1pts) CKD (3pts)  0-3 pts 6% 1 year mortality ; 21% 3 year mortality 4-5 pts 12% 1 year mortality ; 36% 3 year mortality >5 pts 21% 1 year mortality; 54% 3 year mortality   Past Medical History:  Diagnosis Date   Heart murmur 04/1944    No past surgical history on file.  Family History  Problem Relation Age of Onset   Heart disease Brother     Social History   Socioeconomic History   Marital status: Single    Spouse name: Not on file   Number of children: Not on file   Years of education: Not on file   Highest education level: Not on file   Occupational History   Not on file  Tobacco Use   Smoking status: Never   Smokeless tobacco: Never  Vaping Use   Vaping status: Never Used  Substance and Sexual Activity   Alcohol use: Not Currently   Drug use: No   Sexual activity: Not on file  Other Topics Concern   Not on file  Social History Narrative   Not on file   Social Drivers of Health   Financial Resource Strain: Not on file  Food Insecurity: Not on file  Transportation Needs: Not on file  Physical Activity: Not on file  Stress: Not on file  Social Connections: Not on file  Intimate Partner Violence: Not on file    Allergies  Allergen Reactions   Other     Reports all anesthesia causes n/v    Current Outpatient Medications  Medication Sig Dispense Refill   10894 TAKE ONE CAPSULE BY MOUTH 30 MINUTES prior TO morning meal     acetaminophen (TYLENOL) 500 MG tablet Take 1 tablet (500 mg total) by mouth every 6 (six) hours as needed. 30 tablet 0   ketoconazole (NIZORAL) 2 % shampoo USE as directed Externally Twice per week     KETOCONAZOLE, TOPICAL, 1 % SHAM APPLY SHAMPOO BY TOPICAL ROUTE EVERY 3 DAYS LATHER, RINSE, AND REPEAT     Naproxen (NAPROSYN PO) as needed.     predniSONE (DELTASONE) 50 MG tablet Take 1 tablet (50 mg total) by mouth daily. 7 tablet 0   rosuvastatin (CRESTOR) 5 MG tablet Take 1 tablet (5 mg total) by mouth at bedtime. 90 tablet 0   Current Facility-Administered Medications  Medication Dose Route Frequency Provider Last Rate Last Admin   aspirin chewable tablet 81 mg  81 mg Oral Daily Custovic, Sabina, DO       nitroGLYCERIN (NITROSTAT) SL tablet 0.4 mg  0.4 mg Sublingual Q5 min PRN Custovic, Sabina, DO        PHYSICAL EXAM There were no vitals filed for this visit.  Constitutional: *** appearing. *** distress. Appears *** nourished.  Neurologic: CN ***. *** focal findings. *** sensory loss. Psychiatric: *** Mood and affect symmetric and appropriate. Eyes: *** No icterus. No  conjunctival pallor. Ears, nose, throat: *** mucous membranes moist. Midline trachea.  Cardiac: *** rate and rhythm.  Respiratory: *** unlabored. Abdominal: *** soft, non-tender, non-distended.  Peripheral vascular: *** Extremity: *** edema. *** cyanosis. *** pallor.  Skin: *** gangrene. *** ulceration.  Lymphatic: *** Stemmer's sign. *** palpable lymphadenopathy.    PERTINENT LABORATORY AND RADIOLOGIC DATA  Most recent CBC    Latest Ref Rng & Units 05/25/2014    1:18 PM  CBC  WBC 4.0 - 10.5 K/uL 5.2   Hemoglobin 12.0 - 15.0 g/dL 40.1   Hematocrit 02.7 - 46.0 % 38.4   Platelets 150 - 400 K/uL 285      Most recent CMP    Latest Ref Rng & Units 05/25/2014    1:18 PM  CMP  Glucose 70 - 99 mg/dL 253   BUN 6 - 23 mg/dL 9   Creatinine 6.64 - 4.03 mg/dL 4.74   Sodium 259 - 563 mEq/L 141   Potassium 3.7 - 5.3 mEq/L 3.8   Chloride 96 - 112 mEq/L 101   CO2 19 - 32 mEq/L 24   Calcium 8.4 - 10.5 mg/dL 9.6   Total Protein 6.0 - 8.3 g/dL 7.5   Total Bilirubin 0.3 - 1.2 mg/dL <8.7   Alkaline Phos 39 - 117 U/L 72   AST 0 - 37 U/L  23   ALT 0 - 35 U/L 13     Renal function CrCl cannot be calculated (Patient's most recent lab result is older than the maximum 21 days allowed.).  No results found for: "HGBA1C"  No results found for: "LDLCALC", "LDLC", "HIRISKLDL", "POCLDL", "LDLDIRECT", "REALLDLC", "TOTLDLC"   Vascular Imaging: ***  Danija Gosa N. Lenell Antu, MD FACS [facility] Phone Number: [phone] 02/03/2024 10:48 AM   Total time spent on preparing this encounter including chart review, data review, collecting history, examining the patient, coordinating care for this {tnhtimebilling:26202}  Portions of this report may have been transcribed using voice recognition software.  Every effort has been made to ensure accuracy; however, inadvertent computerized transcription errors may still be present.

## 2024-02-04 ENCOUNTER — Encounter: Payer: Self-pay | Admitting: Vascular Surgery

## 2024-02-04 ENCOUNTER — Ambulatory Visit: Payer: Medicare Other | Admitting: Vascular Surgery

## 2024-02-04 ENCOUNTER — Ambulatory Visit (HOSPITAL_COMMUNITY)
Admission: RE | Admit: 2024-02-04 | Discharge: 2024-02-04 | Disposition: A | Discharge status: Home / Self Care | Payer: Medicare Other | Source: Ambulatory Visit | Attending: Surgery | Admitting: Surgery

## 2024-02-04 VITALS — BP 171/76 | HR 90 | Temp 97.8°F | Resp 20 | Ht 62.0 in | Wt 102.0 lb

## 2024-02-04 DIAGNOSIS — I7143 Infrarenal abdominal aortic aneurysm, without rupture: Secondary | ICD-10-CM

## 2024-04-15 DIAGNOSIS — B351 Tinea unguium: Secondary | ICD-10-CM | POA: Diagnosis not present

## 2024-07-23 DIAGNOSIS — B351 Tinea unguium: Secondary | ICD-10-CM | POA: Diagnosis not present

## 2024-08-11 DIAGNOSIS — L989 Disorder of the skin and subcutaneous tissue, unspecified: Secondary | ICD-10-CM | POA: Diagnosis not present

## 2024-08-12 DIAGNOSIS — K2100 Gastro-esophageal reflux disease with esophagitis, without bleeding: Secondary | ICD-10-CM | POA: Diagnosis not present

## 2024-10-07 DIAGNOSIS — N1330 Unspecified hydronephrosis: Secondary | ICD-10-CM | POA: Diagnosis not present

## 2024-10-07 DIAGNOSIS — Z79899 Other long term (current) drug therapy: Secondary | ICD-10-CM | POA: Diagnosis not present

## 2024-10-07 DIAGNOSIS — K55059 Acute (reversible) ischemia of intestine, part and extent unspecified: Secondary | ICD-10-CM | POA: Diagnosis not present

## 2024-10-07 DIAGNOSIS — A419 Sepsis, unspecified organism: Secondary | ICD-10-CM | POA: Diagnosis not present

## 2024-10-07 DIAGNOSIS — R55 Syncope and collapse: Secondary | ICD-10-CM | POA: Diagnosis not present

## 2024-10-07 DIAGNOSIS — R072 Precordial pain: Secondary | ICD-10-CM | POA: Diagnosis not present

## 2024-10-07 DIAGNOSIS — I7143 Infrarenal abdominal aortic aneurysm, without rupture: Secondary | ICD-10-CM | POA: Diagnosis not present

## 2024-10-07 DIAGNOSIS — I513 Intracardiac thrombosis, not elsewhere classified: Secondary | ICD-10-CM | POA: Diagnosis not present

## 2024-10-07 DIAGNOSIS — R9431 Abnormal electrocardiogram [ECG] [EKG]: Secondary | ICD-10-CM | POA: Diagnosis not present

## 2024-10-07 DIAGNOSIS — E785 Hyperlipidemia, unspecified: Secondary | ICD-10-CM | POA: Diagnosis not present

## 2024-10-07 DIAGNOSIS — I4891 Unspecified atrial fibrillation: Secondary | ICD-10-CM | POA: Diagnosis not present

## 2024-10-07 DIAGNOSIS — I10 Essential (primary) hypertension: Secondary | ICD-10-CM | POA: Diagnosis not present

## 2024-10-07 DIAGNOSIS — Z0389 Encounter for observation for other suspected diseases and conditions ruled out: Secondary | ICD-10-CM | POA: Diagnosis not present

## 2024-10-07 DIAGNOSIS — R6510 Systemic inflammatory response syndrome (SIRS) of non-infectious origin without acute organ dysfunction: Secondary | ICD-10-CM | POA: Diagnosis not present

## 2024-10-07 DIAGNOSIS — E872 Acidosis, unspecified: Secondary | ICD-10-CM | POA: Diagnosis not present

## 2024-10-07 DIAGNOSIS — R000 Tachycardia, unspecified: Secondary | ICD-10-CM | POA: Diagnosis not present

## 2024-10-07 DIAGNOSIS — N134 Hydroureter: Secondary | ICD-10-CM | POA: Diagnosis not present

## 2024-10-07 DIAGNOSIS — R1032 Left lower quadrant pain: Secondary | ICD-10-CM | POA: Diagnosis not present

## 2024-10-07 DIAGNOSIS — R32 Unspecified urinary incontinence: Secondary | ICD-10-CM | POA: Diagnosis not present

## 2024-10-07 DIAGNOSIS — Z7982 Long term (current) use of aspirin: Secondary | ICD-10-CM | POA: Diagnosis not present

## 2024-10-07 DIAGNOSIS — K551 Chronic vascular disorders of intestine: Secondary | ICD-10-CM | POA: Diagnosis not present

## 2024-10-07 DIAGNOSIS — J9809 Other diseases of bronchus, not elsewhere classified: Secondary | ICD-10-CM | POA: Diagnosis not present

## 2024-10-07 DIAGNOSIS — I480 Paroxysmal atrial fibrillation: Secondary | ICD-10-CM | POA: Diagnosis not present

## 2024-10-11 NOTE — Care Plan (Signed)
 Problem: Knowledge Deficit Goal: Patient/family/caregiver demonstrates understanding of disease process, treatment plan, medications, and discharge instructions Description: Complete learning assessment and assess knowledge base. 10/11/2024 1436 by Calton Rouleau, RN Outcome: Adequate for Discharge 10/11/2024 0719 by Calton Rouleau, RN Outcome: Progressing   Problem: Compromised Skin Integrity Goal: Skin integrity is maintained or improved Description: Assess and monitor skin integrity. Identify patients at risk for skin breakdown on admission and per policy. Collaborate with interdisciplinary team and initiate plans and interventions as needed.    10/11/2024 1436 by Calton Rouleau, RN Outcome: Adequate for Discharge 10/11/2024 0719 by Calton Rouleau, RN Outcome: Progressing Goal: Fluid and electrolyte balance are achieved/maintained Description: Assess and monitor vital signs (orthostatic vitals if applicable), fluid intake and output, urine color, labs, skin turgor, mucous membranes, jugular venous distention, edema, circumference of edematous extremities and abdominal girth, respiratory status, and mental status.  Monitor for signs and symptoms of hypovolemia (tachycardia, rapid breathing, decreased urine output, postural hypotension, confusion, syncope).  Monitor for signs and symptoms of hypervolemia (strong rapid pulse, shortness of breath, difficulty breathing lying down, crackles heard in lung fields, edema). Collaborate with interdisciplinary team and initiate plan and interventions as ordered. 10/11/2024 1436 by Calton Rouleau, RN Outcome: Adequate for Discharge 10/11/2024 0719 by Calton Rouleau, RN Outcome: Progressing Goal: Nutritional status is improving Description: Monitor and assess patient for malnutrition (ex- brittle hair, bruises, dry skin, pale skin and conjunctiva, muscle wasting, smooth red tongue, and disorientation). Collaborate with interdisciplinary team and initiate  plan and interventions as ordered.  Monitor patient's weight and dietary intake as ordered or per policy. Utilize nutrition screening tool and intervene per policy. Determine patient's food preferences and provide high-protein, high-caloric foods as appropriate.  10/11/2024 1436 by Calton Rouleau, RN Outcome: Adequate for Discharge 10/11/2024 0719 by Calton Rouleau, RN Outcome: Progressing   Problem: Urinary Incontinence Goal: Perineal skin integrity is maintained or improved Description: Assess genitourinary system, perineal skin, labs (urinalysis), and history of incontinence to include past management, aggravating, and alleviating factors.  Collaborate with interdisciplinary team and initiate plans and interventions as needed. 10/11/2024 1436 by Calton Rouleau, RN Outcome: Adequate for Discharge 10/11/2024 0719 by Calton Rouleau, RN Outcome: Progressing   Problem: Risk for Fall-Moderate-IP Goal: Patient will remain free of falls during hospital stay-IP 10/11/2024 1436 by Calton Rouleau, RN Outcome: Adequate for Discharge 10/11/2024 0719 by Calton Rouleau, RN Outcome: Progressing   Problem: PAIN - ADULT Goal: Verbalizes/displays adequate comfort level or baseline comfort level Description: INTERVENTIONS: 1. Encourage pt to monitor pain and request assistance 2. Assess pain using appropriate pain scale 3. Administer analgesics based on type and severity of pain and evaluate response 4. Implement non-pharmacological measures as appropriate and evaluate response 5. Consider cultural and social influences on pain and pain management 6. Notify LIP if interventions unsuccessful or patient reports new pain 10/11/2024 1436 by Calton Rouleau, RN Outcome: Adequate for Discharge 10/11/2024 0719 by Calton Rouleau, RN Outcome: Progressing   Problem: INFECTION - ADULT Goal: Absence of infection during hospitalization Description: INTERVENTIONS: 1. Assess and monitor for signs and symptoms of  infection 2. Monitor lab/diagnostic results 3. Monitor all insertion sites i.e., indwelling lines, tubes and drains 4. Monitor endotracheal (as able) and nasal secretions for changes in amount and color 5. Institute appropriate cooling/warming therapies per order 6. Administer medications as ordered 7. Instruct and encourage patient and family to use good hand hygiene technique 8. Identify and instruct in appropriate isolation precautions for identified infection/condition 10/11/2024 1436 by Calton Rouleau, RN Outcome:  Adequate for Discharge 10/11/2024 0719 by Calton Rouleau, RN Outcome: Progressing Goal: Absence of fever/infection during anticipated neutropenic period Description: INTERVENTIONS 1. Monitor WBC 2. Administer growth factors as ordered 3. Implement neutropenic guidelines as ordered 10/11/2024 1436 by Calton Rouleau, RN Outcome: Adequate for Discharge 10/11/2024 0719 by Calton Rouleau, RN Outcome: Progressing   Problem: Safety - Adult Goal: Free from fall injury Description: INTERVENTIONS: 1. Assess pt frequently for physical needs 2. Identify cognitive and physical deficits and behaviors that affect risk of falls. 3. Institute fall precautions as indicated by assessment. 4. Educate pt/family on patient safety including physical limitations 5. Instruct pt to call for assistance with activity based on assessment 6. Modify environment to reduce risk of injury 7. Consider OT/PT consult to assist with strengthening/mobility 10/11/2024 1436 by Calton Rouleau, RN Outcome: Adequate for Discharge 10/11/2024 0719 by Calton Rouleau, RN Outcome: Progressing Goal: Absence of infection during hospitalization Description: INTERVENTIONS: 1. Assess and monitor for signs and symptoms of infection 2. Monitor lab/diagnostic results 3. Monitor all insertion sites i.e., indwelling lines, tubes and drains 4. Monitor endotracheal (as able) and nasal secretions for changes in amount and  color 5. Institute appropriate cooling/warming therapies per order 6. Administer medications as ordered 7. Instruct and encourage patient and family to use good hand hygiene technique 8. Identify and instruct in appropriate isolation precautions for identified infection/condition 10/11/2024 1436 by Calton Rouleau, RN Outcome: Adequate for Discharge 10/11/2024 0719 by Calton Rouleau, RN Outcome: Progressing   Problem: DISCHARGE PLANNING Goal: Discharge to home or other facility with appropriate resources Description: INTERVENTIONS: 1. Identify barriers to discharge w/pt and caregiver 2. Arrange for needed discharge resources and transportation as appropriate 3. Identify discharge learning needs (meds, wound care, etc) 4. Arrange for interpreters to assist at discharge as needed 5. Refer to Case Management Department for coordinating discharge planning if the patient needs post-hospital services based on physician order or complex needs related to functional status, cognitive ability or social support system 10/11/2024 1436 by Calton Rouleau, RN Outcome: Adequate for Discharge 10/11/2024 0719 by Calton Rouleau, RN Outcome: Progressing   Problem: Chronic Conditions and Co-Morbidities Goal: Patient's chronic conditions and co-morbidity symptoms are monitored and maintained or improved Description: INTERVENTIONS: 1. Monitor and assess patient's chronic conditions and comorbid symptoms for stability, deterioration, or improvement 2. Collaborate with multidisciplinary team to address chronic and comorbid conditions and prevent exacerbation or deterioration 3. Update acute care plan with appropriate goals if chronic or comorbid symptoms are exacerbated and prevent overall improvement and discharge 10/11/2024 1436 by Calton Rouleau, RN Outcome: Adequate for Discharge 10/11/2024 0719 by Calton Rouleau, RN Outcome: Progressing   Problem: Risk for Fall-Low-IP Goal: Patient will remain free of falls  during hospital stay-IP 10/11/2024 1436 by Calton Rouleau, RN Outcome: Adequate for Discharge 10/11/2024 0719 by Calton Rouleau, RN Outcome: Progressing

## 2024-10-11 NOTE — Discharge Summary (Signed)
 ------------------------------------------------------------------------------- Attestation signed by Russ Bonner GAILS, MD at 10/12/2024 10:26 AM Addendum:  Discussed with our APP Ms. Rowland regarding patient; noted findings; agree with assessment and plan.  Patient seen; needs outpatient follow up with Cardiology, vasc surgery; follow up on Holter monitoring. -------------------------------------------------------------------------------  Discharge Summary   PATIENT NAME:  Samantha Burns DOB:  06/13/1933 MRN:  [id number]   Admission Date:   10/07/2024  3:50 PM                      Attending: Russ Bonner GAILS, MD   Discharge Date:   10/11/2024                 Consultants: Treatment Team:  Consulting Physician: Deena Von Najjar, MD    DISCHARGE DIAGNOSIS: Principal Problem:   SIRS (systemic inflammatory response syndrome)    (CMD) Active Problems:   SIRS/Left sided abdominal pain   Infrarenal abdominal aortic aneurysm (AAA) without rupture   Normocytic anemia   Hydroureteronephrosis   Paroxysmal atrial fibrillation    (CMD)   Mesenteric artery stenosis (CMD)   HTN (hypertension)   Near syncope Resolved Problems:   * No resolved hospital problems. *   Hospital Course: 89 year old female with past medical history of infrarenal AAA, hypertension and hyperlipidemia who presented to ED with complaint of L sided ABD pain starting around 11 AM 12/3 after breakfast associated with nausea, nonbloody/nonbilious vomiting.    Patient reported that she began feeling unwell and subsequently laid down on the floor in her living room where she was found by her son nearly unconscious.  CT A&P concerning for pyelo with potentially recently passed nephrolithiasis versus ascending UTI/obstructing neoplasm.  Additionally, there was concern for aortic iliac atherosclerosis with 2.7 cm infrarenal abdominal aortic aneurysm with eccentric mural thrombus.  Basic lab work with mildly elevated WBC 12.3  and creatinine 1.06 however otherwise unremarkable.  Treated with IVF in the setting of elevated lactate 7.8 with rapid resolution.  Initiated on empiric Rocephin where urine culture noted no growth so antibiotics discontinued.  Pain ultimately resolved essentially with only IVF.  New onset A-fib 12/5 with RVR later in the day prompting IV metoprolol for RVR.  Case discussed with Urology and Vascular surgery 12/6 as below.  Remained hospitalized to monitor for any recurrent A-fib, feels comfortable discharging as of 12/7.  Long discussion held at bedside with patient.  She is essentially felt back to her baseline within 24-36 hours of presenting to the hospital.  She has been ambulating and tolerating p.o.  She notes a longstanding history of overactive bladder and concerns for pelvic floor dysfunction following vaginal birth of her 2 children several years ago.  Because of this, she has substantially cut back her coffee and tea intake and appears to have been taking minimal p.o. prior to admission.  Discussed that it is important for her to increase her p.o. intake.  She is aware of all the CT findings based on our discussion today as well as plan for urology/vascular follow-up.  Long discussion held yesterday and today regarding use of anticoagulation and risks for bleeding in the setting of any trauma.  Both her and her son are aware of strict return precautions including GI bleeding, hematuria, or trauma.  Additionally discussed use of metoprolol for A-fib.  Her BP has been noted to be elevated this admission however previous documentation indicates that she is sensitive to antihypertensive medication.  Telemetry reviewed without recurrent A-fib however did note some  intermittent bradycardia while sleeping HR in the high 40s thus metoprolol has been reduced as below.  Asked that I find her a new PCP in [place] after relocating here from [place], referral sent. Assessment & Plan SIRS/Left sided abdominal  pain Near syncope Presenting with left-sided abdominal pain with n/v Noted to be have tachycardia labs remarkable for lactate 3.9-->7.8-> 2.5-> 1.2 & WBC 12.3-->16.76-> WNL by 12/5 UA with 25 leuk est, 6-10 WBC & no bacteria seen Empiric Rocephin 12/3-12/4 & dc'd after urine culture without growth. CT Head 12/3 (-)  CT abdomen pelvis with bilateral patchy areas of renal hypoenhancement, mild thickening at left UVJ with mild left hydroureter-possible recently passed stone, ascending urinary tract infection, or obstructing neoplasm. Severe stenosis at the origin of the superior mesenteric artery & severe stenosis at the origin of the inferior mesenteric artery which arises immediately beneath the ulcerated saccular infrarenal aortic aneurysm.  No evidence of mesenteric ischemia.  CT reviewed by Urology, non specific inflammation noted. Needs OP f/u with repeated imaging. Referral sent after discussion 12/6.  CT additionally reviewed by Vascular Surgery as below, do not believe patient suffering from ulcerated aneurysm & okay with anticoagulation Etiology of elevated lactate & near syncope largely unknown however patient improved symptomatically with IVF alone.  Question if this could be an episode of A-fib with RVR after it was noted during admission in addition to potential for transient mesenteric ischemia. Paroxysmal atrial fibrillation    (CMD) New diagnosis this admission Noted on telemetry, initial EKG noting sinus tach however potential conversion noted. Repeat EKG later in the day 12/5 with A-fib + RVR.  TTE 12/5->EF 56%, no WMA, mitral valve calcification, mildly dilated LA Required several doses of IV Metoprolol 2.5 mg 12/5 converting to NSR late 12/5 Metoprolol 25 mg 3 times daily adjusted to Toprol-XL 50 mg daily on DC due to mild asymptomatic bradycardia Initiated on Eliquis 12/6 based on CHA2DS2-VASc score 5 (age, sex, HTN, & vascular disease).  Several discussions held with patient  regarding strict return precautions & safety precautions with anticoagulation use Referred to cardiology on DC HTN (hypertension) SBP during admission noted to be labile 120-180; patient adamantly denies history of HTN On chart review appears there were issues with hypertensive urgency dating back to 2023 where she was treated at an OP office with nitroglycerin  with a rapid drop in her BP & where she was symptomatic with near syncope Reluctant to initiate antihypertensives due to this & wants to watch BP with initiation of metoprolol as above Encouraged her to check her BP twice daily & keep a log She has been referred to a PCP for BP monitoring Given her age, goal would be <140/80 Infrarenal abdominal aortic aneurysm (AAA) without rupture Mesenteric artery stenosis (CMD) Known 2.7 cm infrarenal AAA with eccentric mural thrombus noted on CT.  Reviewed by Vascular 12/6, more of a saccular morphology with ulceration.  CT A&P 12/4-> SMA stenosis may be moderate to severe as well as that of the celiac artery at the origin  Per Vascular Surgery no current symptoms of chronic mesenteric ischemia, these are likely incidental finding  Follows with vascular surgery at [facility], last visit 02/2024. Given she has relocated to [place], referred to vascular surgery at [facility] on DC.  Normocytic anemia Hgb 11.7 on arrival & s/p 1.5 liter bolus along with 100 ml/hr mIVF repeat stable ~ 9 on several draws  B12 1177 & folate WNL. TIBC & transferrin sat WNL.   Physical Exam Constitutional:  Comments: Sitting up in bed  HENT:     Head: Normocephalic and atraumatic.     Nose: Nose normal.     Mouth/Throat:     Mouth: Mucous membranes are moist.  Eyes:     General: No scleral icterus.    Extraocular Movements: Extraocular movements intact.     Pupils: Pupils are equal, round, and reactive to light.  Cardiovascular:     Rate and Rhythm: Normal rate and regular rhythm.     Pulses: Normal pulses.      Heart sounds: Normal heart sounds.  Pulmonary:     Effort: Pulmonary effort is normal.     Breath sounds: Normal breath sounds.  Abdominal:     General: Abdomen is flat. Bowel sounds are normal. There is no distension.     Palpations: Abdomen is soft.     Tenderness: There is no abdominal tenderness.  Musculoskeletal:     Cervical back: Normal range of motion and neck supple.     Right lower leg: No edema.     Left lower leg: No edema.  Skin:    General: Skin is warm and dry.     Capillary Refill: Capillary refill takes less than 2 seconds.     Findings: No rash.  Neurological:     General: No focal deficit present.     Mental Status: She is alert and oriented to person, place, and time.  Psychiatric:        Mood and Affect: Mood normal.        Behavior: Behavior normal.        Judgment: Judgment normal.     Discharge Medications:   Discharge Medications     New Medications      Sig Disp Refill Start End  apixaban 2.5 mg Tab Commonly known as: ELIQUIS  Take 1 tablet (2.5 mg total) by mouth 2 (two) times a day.  60 tablet  0     metoprolol succinate 50 mg 24 hr tablet Commonly known as: TOPROL XL  Take 1 tablet (50 mg total) by mouth daily.  30 tablet  2     rosuvastatin  5 mg tablet Commonly known as: CRESTOR   Take 1 tablet (5 mg total) by mouth at bedtime.  30 tablet  11         Medications To Continue      Sig Disp Refill Start End  pantoprazole 40 mg EC tablet Commonly known as: PROTONIX  Take 40 mg by mouth daily.   0     STOOL SOFTENER ORAL  Take 1 capsule by mouth at bedtime.   0         Stopped Medications    UNABLE TO FIND         Follow Up Appointments: No future appointments.  Condition on Discharge: Good Discharge Disposition: Home   Discharge Time: 55 mins   Signed Electronically:   Damien KATHEE Ades, NP 1:20 PM  10/11/2024  XR Chest 1 View  Final Result by Lynwood Inks, MD (12/03 2041)  DATE OF SERVICE:   10/07/2024 8:39 pm    EXAM:  XR CHEST 1 VIEW    CLINICAL HISTORY:    fever    COMPARISON:  CT ABDOMEN PELVIS W IV ONLY, ACC: [id number], dated 2024-10-07   17:04:31    IMPRESSION:  1. Peribronchial cuffing consistent with airway inflammation.  2. Streaky bilateral parahilar and lower lobe interstitial infiltrate and   or fibrosis  3. Heart size within  normal limits  4. Calcified plaquing aortic knob      THIS IS AN ELECTRONICALLY VERIFIED FINAL REPORT  10/07/2024 8:41 PM - Electronically signed by Lynwood Inks   Workstation: [id number]  [facility]    CT Head WO Contrast W Quant CT Tiss Character When Performed  Final Result by Ozell Isabella Cap, MD (12/03 1909)  DATE OF SERVICE:  10/07/2024 6:59 pm    EXAM:  CT HEAD WITHOUT CONTRAST    CLINICAL HISTORY:  FOund down at home    COMPARISON:  No Comparison.    TECHNIQUE:  Standard noncontrast axial head CT.  Dose lowering technique such as   automated exposure control, iterative reconstruction and mA and/or KV   adjustment for patient size was utilized for this exam.    FINDINGS:  Contrast seen related to recent contrast administration from abdomen   pelvis CT.SABRA No intracranial mass, hemorrhage or acute cortical infarction.   No extra-axial collection. Basal cisterns are patent. No mass effect.   Moderate brain atrophy and moderate chronic small vessel ischemic disease.   Orbits are normal. Paranasal sinuses are normal. Mastoid air cells are   normal.    IMPRESSION:  No acute intracranial process.    THIS IS AN ELECTRONICALLY VERIFIED FINAL REPORT  10/07/2024 7:09 PM - Electronically signed by Ozell Cap   Workstation: [id number]  [facility]    CT Abdomen Pelvis W IV Only  Final Result by Cloyd Carl, DO (12/03 2300)  Addendum (preliminary) 1 of 1 by Cloyd Carl, DO (12/03 2300)  ADDENDUM REPORT: 10/07/2024 11:00 PM    ADDENDUM:  I have reviewed the images upon further  request for CT angiogram Abd and   pelvis for ischemia. The following are noted:    1. There is severe stenosis at the origin of the superior mesenteric   artery with normal opacifications of the mid and distal branches.  2. There is severe stenosis at the origin of the inferior mesenteric   artery which arises immediately beneath the ulcerated saccular infrarenal   aortic aneurysm. The inferior mesenteric branches are normally opacified.  3. Negative for portal venous thrombosis or significant mesenteric   congestion.  4. Negative for pneumatosis intestinalis or portal venous gas.      This report was called by Cloyd Carl DO to Almarie Lee MD at   10:59 pm on 10/07/2024.    THIS IS AN ELECTRONICALLY VERIFIED FINAL REPORT  10/07/2024 11:00 PM - Electronically signed by Cloyd Carl   Workstation: [id number]  [facility]  DATE OF SERVICE:  10/07/2024 5:13 pm    EXAM:  CT ABDOMEN AND PELVIS WITH IV CONTRAST    CLINICAL HISTORY:  LLQ pain, acute    COMPARISON:  No Comparison.    TECHNIQUE:  Following administration of IV contrast, axial images were acquired   through the abdomen and pelvis.    Dose lowering technique(s) such as automated exposure control, iterative   reconstruction, and mA and/or KV adjustment for patient size was utilized   for this exam.    CONTRAST:    iohexoL (OMNIPAQUE) 350 mg iodine/mL injection (MDV) 80 mL - amount   administered: 80 mL  //    If this exam was performed in conjunction with another study in the same   setting, the volume of IV contrast dictated in each report was the total   volume of contrast administered for the exams.    FINDINGS:  LOWER CHEST: 5 mm right lower lobe pulmonary  nodule is nonspecific.    LIVER: Normal.  GALLBLADDER AND BILIARY TREE: Nondilated biliary tree. Normal gallbladder.  SPLEEN: Normal.  PANCREAS: Diffuse fatty atrophy.  ADRENAL GLANDS: Normal.  KIDNEYS AND URETERS: Bilateral  lobulated scarring of the kidneys.   Bilateral patchy areas of renal hypoenhancement. No stones or   hydronephrosis. Mild thickening at the left ureterovesical junction with   mild left hydroureter could be on the basis of recently passed stone,   ascending urinary tract infection, or obstructing neoplasm.    BLADDER: Normal.  REPRODUCTIVE ORGANS: 2.8 cm left adnexal cyst.    BOWEL: No bowel-related abnormality. No bowel obstruction or bowel   inflammation. Normal appendix. No evidence of active colitis or   diverticulitis.  PERITONEUM/MESENTERY/OMENTUM: No free air or ascites.  VASCULATURE: Aortoiliac atherosclerosis with 2.7 cm infrarenal abdominal   aortic aneurysm with eccentric mural thrombus.  LYMPH NODES: No adenopathy or hematoma.  BONES AND SOFT TISSUES: Normal abdominal wall. No acute bony abnormality.    IMPRESSION:  1. Bilateral patchy areas of renal hypoenhancement. Correlate with   urinalysis for the possibility of pyelonephritis.  2. Mild thickening at the left ureterovesical junction with mild left   hydroureter could be on the basis of recently passed stone, ascending   urinary tract infection, or obstructing neoplasm. Follow up to complete   resolution is required as well as dedicated urology follow up.  3. No acute bowel related abnormality or evidence of diverticulitis.        THIS IS AN ELECTRONICALLY VERIFIED FINAL REPORT  10/07/2024 6:14 PM - Electronically signed by Cheryal Florence   Workstation: [id number]  [facility]    Final  DATE OF SERVICE:  10/07/2024 5:13 pm    EXAM:  CT ABDOMEN AND PELVIS WITH IV CONTRAST    CLINICAL HISTORY:  LLQ pain, acute    COMPARISON:  No Comparison.    TECHNIQUE:  Following administration of IV contrast, axial images were acquired   through the abdomen and pelvis.    Dose lowering technique(s) such as automated exposure control, iterative   reconstruction, and mA and/or KV adjustment for patient size was  utilized   for this exam.    CONTRAST:    iohexoL (OMNIPAQUE) 350 mg iodine/mL injection (MDV) 80 mL - amount   administered: 80 mL  //    If this exam was performed in conjunction with another study in the same   setting, the volume of IV contrast dictated in each report was the total   volume of contrast administered for the exams.    FINDINGS:  LOWER CHEST: 5 mm right lower lobe pulmonary nodule is nonspecific.    LIVER: Normal.  GALLBLADDER AND BILIARY TREE: Nondilated biliary tree. Normal gallbladder.  SPLEEN: Normal.  PANCREAS: Diffuse fatty atrophy.  ADRENAL GLANDS: Normal.  KIDNEYS AND URETERS: Bilateral lobulated scarring of the kidneys.   Bilateral patchy areas of renal hypoenhancement. No stones or   hydronephrosis. Mild thickening at the left ureterovesical junction with   mild left hydroureter could be on the basis of recently passed stone,   ascending urinary tract infection, or obstructing neoplasm.    BLADDER: Normal.  REPRODUCTIVE ORGANS: 2.8 cm left adnexal cyst.    BOWEL: No bowel-related abnormality. No bowel obstruction or bowel   inflammation. Normal appendix. No evidence of active colitis or   diverticulitis.  PERITONEUM/MESENTERY/OMENTUM: No free air or ascites.  VASCULATURE: Aortoiliac atherosclerosis with 2.7 cm infrarenal abdominal   aortic aneurysm with eccentric mural thrombus.  LYMPH NODES: No adenopathy or hematoma.  BONES AND SOFT TISSUES: Normal abdominal wall. No acute bony abnormality.    IMPRESSION:  1. Bilateral patchy areas of renal hypoenhancement. Correlate with   urinalysis for the possibility of pyelonephritis.  2. Mild thickening at the left ureterovesical junction with mild left   hydroureter could be on the basis of recently passed stone, ascending   urinary tract infection, or obstructing neoplasm. Follow up to complete   resolution is required as well as dedicated urology follow up.  3. No acute bowel related abnormality or  evidence of diverticulitis.        THIS IS AN ELECTRONICALLY VERIFIED FINAL REPORT  10/07/2024 6:14 PM - Electronically signed by Cheryal Florence   Workstation: [id number]  [facility]

## 2024-10-11 NOTE — Progress Notes (Signed)
 Case Management Final Discharge Note Patient Information: Name: Samantha Burns  DOB:10/11/1933 Gender:female MRN: [id number]  Admission Date: 10/07/2024 Discharge Date:10/11/2024  3:59 PM Primary Care Provider: No Pcp Preferred Pharmacy: [facility] [id number] [place], [place] - [address] - PHONE: [phone] - FAX: [phone]   Unplanned Readmission Score:  10.73 Discharge address: [address] [place] [address] Discharge Transportation: family  Discharge Barriers Discharge Barriers Identified  : No Barriers Identified  Discharge Plan Discharge Disposition: Home or Self Care          Anticipated Discharge Location: Home  If Plan A discharging location is not feasible: Potential Plan B: Home  Caregiver at home: Self  Discharge Readiness:  Discharge Plan was discussed with Patient Any concerns with discharge plan: No concerns verbalized with current anticipated discharge plan         Final Destination Choice:    Final Assessment Narrative: At this time; Patient has been deemed medically ready for discharge. Patient was discharged home with self-care today and family assisted with transportation. During this visit, Pt did not have any skill and DME needs as well as did not need any community resources. Patient was made aware that their Medication(s) was sent to their preferred pharmacy; [facility] in [place], [place].     At this time, Patient does not have any other Discharge Planning needs from CCM SW resulting in a safe discharge.   Important Message from Medicare form (IM) reviewed and signed at bedside with patient . Copy provided to patient.         Post Acute Referral Placement   No notes of this type exist for this encounter.    Assessment Completed by: Darice Pringle

## 2024-10-12 ENCOUNTER — Telehealth: Payer: Self-pay | Admitting: *Deleted

## 2024-10-12 NOTE — Transitions of Care (Post Inpatient/ED Visit) (Signed)
   10/12/2024  Name: Hanifa Antonetti MRN: [id number] DOB: 1933/05/29  Today's TOC FU Call Status: Today's TOC FU Call Status:: Unsuccessful Call (1st Attempt) Unsuccessful Call (1st Attempt) Date: 10/12/24  Attempted to reach the patient regarding the most recent Inpatient/ED visit.  Follow Up Plan: Additional outreach attempts will be made to reach the patient to complete the Transitions of Care (Post Inpatient/ED visit) call.   Cathlean Headland BSN RN  [facility] Health Care Management Coordinator [email or web address] Direct Dial: [phone]  Fax: [phone] Website: [email or web address]

## 2024-10-12 NOTE — Transitions of Care (Post Inpatient/ED Visit) (Signed)
   10/12/2024  Name: Samantha Burns MRN: [id number] DOB: Oct 21, 1933  Today's TOC FU Call Status: Today's TOC FU Call Status:: Successful TOC FU Call Completed TOC FU Call Complete Date: 10/12/24  Patient's Name and Date of Birth confirmed. Name, DOB  Transition Care Management Follow-up Telephone Call Date of Discharge: 10/11/24 Discharge Facility: Other ([facility]) Name of Other ([facility]) Discharge Facility: [facility] Type of Discharge: Inpatient Admission Primary Inpatient Discharge Diagnosis:: (systemic inflammatory response syndrome) How have you been since you were released from the hospital?: Better Any questions or concerns?: No  Items Reviewed: Did you receive and understand the discharge instructions provided?: Yes Medications obtained,verified, and reconciled?: Yes (Medications Reviewed) Any new allergies since your discharge?: No Dietary orders reviewed?: No Do you have support at home?: Yes People in Home [RPT]: alone Name of Support/Comfort Primary Source: Morene son  Medications Reviewed Today: Medications Reviewed Today   Medications were not reviewed in this encounter     Home Care and Equipment/Supplies: Were Home Health Services Ordered?: NA Any new equipment or medical supplies ordered?: NA  Functional Questionnaire: Do you need assistance with bathing/showering or dressing?: No Do you need assistance with meal preparation?: No Do you need assistance with eating?: No Do you have difficulty maintaining continence: No Do you need assistance with getting out of bed/getting out of a chair/moving?: No Do you have difficulty managing or taking your medications?: No  Follow up appointments reviewed: PCP Follow-up appointment confirmed?: No MD Provider Line Number:[phone] Given: Yes (Patient has moved to [facility] and getting a new PCP) Specialist Hospital Follow-up appointment confirmed?: No Reason Specialist Follow-Up Not Confirmed:  Patient has Specialist Provider Number and will Call for Appointment (Patient has been referred to a cardiologist in [place]) Do you need transportation to your follow-up appointment?: No Do you understand care options if your condition(s) worsen?: Yes-patient verbalized understanding  SDOH Interventions Today    Flowsheet Row Most Recent Value  SDOH Interventions   Food Insecurity Interventions Intervention Not Indicated  Housing Interventions Intervention Not Indicated  Transportation Interventions Intervention Not Indicated, Patient Resources (Friends/Family)  Utilities Interventions Intervention Not Indicated   Discussed and offered 30 day TOC program.  Patient  declined.  The patient has been provided with contact information for the care management team and has been advised to call with any health -related questions or concerns.  The patient verbalized understanding with current plan of care.  The patient is directed to their insurance card regarding availability of benefits coverage  Cathlean Headland BSN RN [facility] Health Care Management Coordinator [email or web address] Direct Dial: [phone]  Fax: [phone] Website: [email or web address]

## 2024-10-15 DIAGNOSIS — E7849 Other hyperlipidemia: Secondary | ICD-10-CM | POA: Diagnosis not present

## 2024-10-15 DIAGNOSIS — I10 Essential (primary) hypertension: Secondary | ICD-10-CM | POA: Diagnosis not present

## 2024-10-15 DIAGNOSIS — I480 Paroxysmal atrial fibrillation: Secondary | ICD-10-CM | POA: Diagnosis not present

## 2024-11-09 NOTE — H&P (View-Only) (Signed)
 Vascular Surgery Clinic   Patient PCP: No Pcp  History of Present Illness/Updates Samantha Burns is a 89 y.o. female who was recently admitted to [facility] with a nausea and left lower quadrant abdominal pain.  She underwent a workup and was discharged home.  Part of her workup included a CT of the abdomen and pelvis.  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  She is now returning to her baseline function.  She is very active and lives in independent lifestyle.  She still rides horses and travels.   ROS Review of Systems  All other systems reviewed and are negative.     Current Medications List Current Medications[1]  Physical Exam Physical Exam Pulmonary:     Effort: Pulmonary effort is normal.  Neurological:     General: No focal deficit present.     Mental Status: She is alert.     Vitals:   11/09/24 1406  BP: (!) 177/94  Pulse: 76      Vascular study images were independently reviewed by me and results discussed with the patient.  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  She will require an endovascular repair.  This most likely can be done with a 1 piece endovascular limb but may possibly require kissing iliac stents as well.  The risks and benefits of been explained to her and she wishes to proceed.  I also reach out to her son to inform him of the risks and benefits as well as my recommendations       [1]  Current Outpatient Medications:    apixaban (ELIQUIS) 2.5 mg tab, Take 1 tablet (2.5 mg total) by mouth 2 (two) times a day., Disp: 60 tablet, Rfl: 0   aspirin  81 mg EC tablet, Take 81 mg by mouth daily., Disp: , Rfl:    cholecalciferol (VITAMIN D3) 1,000 unit (25 mcg) tablet, Take 1,000 Units by mouth daily., Disp: , Rfl:    cyanocobalamin (VITAMIN B12) 1,000 mcg tablet, Take 1,000 mcg by mouth daily., Disp: , Rfl:    docusate sodium (COLACE) 100 mg  capsule, Take 100 mg by mouth 2 (two) times a day., Disp: , Rfl:    docusate sodium (STOOL SOFTENER ORAL), Take 1 capsule by mouth at bedtime., Disp: , Rfl:    losartan (COZAAR) 25 mg tablet, Take 1 tablet (25 mg total) by mouth daily., Disp: 90 tablet, Rfl: 3   metoprolol succinate (TOPROL XL) 50 mg 24 hr tablet, Take 1 tablet (50 mg total) by mouth daily., Disp: 30 tablet, Rfl: 2   miscellaneous medical supply (Blood Pressure Cuff) misc, Use as directed., Disp: 1 each, Rfl: 0   pantoprazole (PROTONIX) 40 mg EC tablet, Take 40 mg by mouth daily., Disp: , Rfl:    rosuvastatin  (CRESTOR ) 5 mg tablet, Take 1 tablet (5 mg total) by mouth at bedtime., Disp: 30 tablet, Rfl: 11

## 2024-12-01 NOTE — Interval H&P Note (Signed)
 H&P reviewed. The patient was examined and there are no changes to the H&P.  Izetta Fitting, MD PGY-6 Vascular Surgery Fellow

## 2024-12-02 NOTE — Nursing Note (Signed)
 Patient nearing discharge s/p EVAR on 1/27 with Dr. Romero.  Vascular discharge education completed.   Educated on the following: - follow up appointment - medications - diet - restrictions - incision care - wound care - community resources - when to call our office - showering/bathing  Patient was alert and attentive. Patient verbalized understanding. Patient asked questions.

## 2024-12-02 NOTE — Progress Notes (Signed)
 Case Management Short Stay Assessment Initial/Discharge Note Patient Information: DOB:1933/10/29 Gender:female Admission Date: 12/01/2024  Extended Emergency Contact Information Primary Emergency Contact: Drabik,Benjamin Mobile Phone: [phone] Relation: Son Guardian Information:    Short Stay Assessment:  Do you have a way to pay for medications after you leave the hospital? : Yes Anticipated Discharge Location: Home  If Plan A discharging location is not feasible: Potential Plan B: Home Discharge Planning Assessment: No CCM needs. Home independently. KT BSW  Discharge Readiness:  Discharge Plan discussed with:  Name:  Relationship to Patient:  Phone Number: Date: Wed 12/02/2024 Any concerns with discharge plan: No concerns verbalized with current anticipated discharge plan     Discharge Barriers        Assessment Completed by: Josette Birmingham, BSW

## 2024-12-02 NOTE — Discharge Summary (Signed)
" °  VASCULAR SURGERY  DISCHARGE SUMMARY   Assessment/Plan  Service: Vascular Surgery     Discharge Diagnoses Principal Problem:   Penetrating ulcer of aorta Resolved Problems:   * No resolved hospital problems. [facility] Course Samantha Burns is a 89 year old female with HTN, PAF on Eliquis, and HLDwho presented to [facility] on 1/27/026 and underwent endovascular aortic aneurysm repair (EVAR) for penetrating aortic ulcer by Dr. Romero.  She was observed overnight and deemed medically ready for discharge on pod #1.  At time of discharge she is alert, oriented, tolerating solid diet and pain well controlled.  She will have scheduled follow up as outlined below.    Results Pending At Discharge Pending Labs     Order Current Status   Prepare RBC (Sunquest Info) Preliminary result         Discharge Medications     Modified Medications      Sig Disp Refill Start End  losartan 25 mg tablet Commonly known as: COZAAR What changed: when to take this  Take 1 tablet (25 mg total) by mouth daily.  90 tablet  3     vibegron 75 mg Tab What changed: when to take this  Take 1 tablet (75 mg total) by mouth daily.  90 tablet  3         Medications To Continue      Sig Disp Refill Start End  apixaban 2.5 mg Tab Commonly known as: ELIQUIS  Take 1 tablet (2.5 mg total) by mouth 2 (two) times a day.  180 tablet  3     aspirin  81 mg EC tablet  Take 81 mg by mouth every morning.   0     cholecalciferol 1,000 unit (25 mcg) tablet Commonly known as: VITAMIN D3  Take 1,000 Units by mouth daily.   0     cyanocobalamin 1,000 mcg tablet Commonly known as: VITAMIN B12  Take 1,000 mcg by mouth daily.   0     metoprolol succinate 50 mg 24 hr tablet Commonly known as: TOPROL XL  Take 1.5 tablets (75 mg total) by mouth daily.  135 tablet  3     miscellaneous medical supply Misc Commonly known as: Blood Pressure Cuff  Use as directed.  1 each  0     pantoprazole 40  mg EC tablet Commonly known as: PROTONIX  Take 40 mg by mouth every morning before breakfast.   0     rosuvastatin  5 mg tablet Commonly known as: CRESTOR   Take 1 tablet (5 mg total) by mouth at bedtime.  30 tablet  11     STOOL SOFTENER ORAL  Take 1 capsule by mouth nightly as needed.   0          Outpatient Follow-Up Future Appointments  Date Time Provider Department Center  12/14/2024  2:00 PM [facility] 2 [facility] MMP CT [facility]  01/15/2025  9:00 AM [facility] 3 BLD [facility]  01/15/2025 10:00 AM Macie Romero, MD [facility]  01/21/2025  2:30 PM Abby Fredrich Mead, PA [facility] None  05/25/2025  2:40 PM Shelba Nat Piety, MD [facility]   This discharge summary is completed via review of EMR.  Patient was evaluated by vascular fellow, not myself and deemed medically stable day of discharge.    35 minutes were spent on discharge planning  CarePort Referral Information   No data to display    "

## 2024-12-03 ENCOUNTER — Telehealth: Payer: Self-pay

## 2024-12-03 NOTE — Transitions of Care (Post Inpatient/ED Visit) (Signed)
 "  12/03/2024  Name: Samantha Burns MRN: [id number] DOB: 1933-04-24  Today's TOC FU Call Status: Today's TOC FU Call Status:: Successful TOC FU Call Completed TOC FU Call Complete Date: 12/03/24  Patient's Name and Date of Birth confirmed. Name, DOB  Transition Care Management Follow-up Telephone Call Date of Discharge: 12/02/24 Discharge Facility: Other [facility]) Name of Other (Non-[facility]) Discharge Facility: [facility] Type of Discharge: Inpatient Admission Primary Inpatient Discharge Diagnosis:: Endovascular aortic aneurysm repair How have you been since you were released from the hospital?: Better Any questions or concerns?: No  Items Reviewed: Did you receive and understand the discharge instructions provided?: Yes Medications obtained,verified, and reconciled?: Yes (Medications Reviewed) Any new allergies since your discharge?: No Dietary orders reviewed?: No Do you have support at home?: Yes (relative living with patient) People in Home [RPT]: other relative(s)  Medications Reviewed Today: Medications Reviewed Today     Reviewed by Elaine Almarie LABOR, RN (Registered Nurse) on 12/03/24 at 1522  Med List Status: <None>   Medication Order Taking? Sig Documenting Provider Last Dose Status Informant  [id number]  TAKE ONE CAPSULE BY MOUTH 30 MINUTES prior TO morning meal  Patient not taking: Reported on 10/12/2024   [provider]  Active   acetaminophen  (TYLENOL ) 500 MG tablet [id number]  Take 1 tablet (500 mg total) by mouth every 6 (six) hours as needed.  Patient not taking: Reported on 12/03/2024   Nivia Colon, PA-C  Active   apixaban WINN) 2.5 MG TABS tablet [id number] Yes Take 2.5 mg by mouth 2 (two) times daily. [provider]  Active   aspirin  chewable tablet 81 mg [id number]   Custovic, Sabina, DO  Active   aspirin  EC 81 MG tablet [id number] Yes Take 81 mg by mouth daily. Swallow whole. [provider]   Active   Cholecalciferol 25 MCG (1000 UT) CHEW [id number] Yes Chew 1,000 Units by mouth daily. [provider]  Active   cyanocobalamin (VITAMIN B12) 1000 MCG tablet [id number] Yes Take 1,000 mcg by mouth daily. [provider]  Active   docusate sodium (COLACE) 100 MG capsule [id number] Yes Take 100 mg by mouth daily as needed. [provider]  Active   losartan (COZAAR) 25 MG tablet [id number] Yes Take 25 mg by mouth daily. [provider]  Active   metoprolol succinate (TOPROL-XL) 50 MG 24 hr tablet [id number] Yes Take 50 mg by mouth daily. Take with or immediately following a meal.  Patient taking differently: Take 75 mg by mouth daily. Takes 1.5 tablets to equal 75mg    [provider]  Active   Naproxen (NAPROSYN PO) [id number]  as needed.  Patient not taking: Reported on 12/03/2024   [provider]  Active   nitroGLYCERIN  (NITROSTAT ) SL tablet 0.4 mg [id number]   Custovic, Sabina, DO  Active   pantoprazole (PROTONIX) 40 MG tablet [id number] Yes Take 40 mg by mouth every morning. [provider]  Active   pantoprazole (PROTONIX) 40 MG tablet [id number]  Take 40 mg by mouth daily.  Patient not taking: Reported on 12/03/2024   [provider]  Active   rosuvastatin  (CRESTOR ) 5 MG tablet [id number] Yes Take 1 tablet (5 mg total) by mouth at bedtime. Custovic, Sabina, DO  Active   rosuvastatin  (CRESTOR ) 5 MG tablet [id number]  Take 5 mg by mouth daily.  Patient not taking: Reported on 12/03/2024   [provider]  Active   Vibegron 75 MG TABS [id number] Yes Take 75  mg by mouth daily. [provider]  Active             Home Care and Equipment/Supplies: Were Home Health Services Ordered?: No Any new equipment or medical supplies ordered?: No  Functional Questionnaire: Do you need assistance with bathing/showering or dressing?: No Do you need assistance with meal preparation?: No Do you need assistance with  eating?: No Do you have difficulty maintaining continence: Yes Do you need assistance with getting out of bed/getting out of a chair/moving?: No Do you have difficulty managing or taking your medications?: No  Follow up appointments reviewed: PCP Follow-up appointment confirmed?: No (said PCP will call patient to schedule) MD Provider Line [phone] Given: No Specialist Hospital Follow-up appointment confirmed?: Yes Date of Specialist follow-up appointment?: 01/15/25 Follow-Up Specialty Provider:: Dr. Romero Do you need transportation to your follow-up appointment?: No Do you understand care options if your condition(s) worsen?: Yes-patient verbalized understanding  SDOH Interventions Today    Flowsheet Row Most Recent Value  SDOH Interventions   Food Insecurity Interventions Intervention Not Indicated  Housing Interventions Intervention Not Indicated  Transportation Interventions Intervention Not Indicated  Utilities Interventions Intervention Not Indicated  Social Connections Interventions Intervention Not Indicated  [Patient now lving in senior indepnedent living complex]   Discussed and offered 30 day TOC program.  Patient not enrolled because she has moved to a senior independent living complex in [place] and has changed her PCP to Dr. Shelba Piety, a non-[facility] provider.  She is feeling very well and has family supporting her during this transition of care.  No needs identified at this time.  The patient has been provided with contact information for the care management team and has been advised to call with any health -related questions or concerns.  The patient verbalized understanding with current plan of care.  The patient is directed to their insurance card regarding availability of benefits coverage.    Almarie Shams, RN [place] / [facility] Health RN Care Manager / Transition of Care Direct Dial: [phone]  "

## 2024-12-03 NOTE — Transitions of Care (Post Inpatient/ED Visit) (Signed)
" ° °  12/03/2024  Name: Samantha Burns MRN: [id number] DOB: 06-13-1933  Today's TOC FU Call Status: Today's TOC FU Call Status:: Unsuccessful Call (1st Attempt) Unsuccessful Call (1st Attempt) Date: 12/03/24  Attempted to reach the patient regarding the most recent Inpatient/ED visit.  Follow Up Plan: Additional outreach attempts will be made to reach the patient to complete the Transitions of Care (Post Inpatient/ED visit) call.  Per chart review, appears patient moved to [facility] and has new PCP Dr. Shelba Piety.  Almarie Shams, RN [place] / [facility] Health RN Care Manager / Transition of Care Direct Dial: [phone]  "

## 2024-12-03 NOTE — Patient Instructions (Signed)
 Visit Information  Thank you for taking time to visit with me today. Please don't hesitate to contact me if I can be of assistance to you.  Today we discussed assessing your incision daily. Please see attached patient education sheet.  Patient verbalizes understanding of instructions and care plan provided today and agrees to view in MyChart. Active MyChart status and patient understanding of how to access instructions and care plan via MyChart confirmed with patient.     The patient has been provided with contact information for the care management team and has been advised to call with any health related questions or concerns.  Follow up with provider re: post-procedure follow-up with Cardiologist  Please call the Suicide and Crisis Lifeline: 988 if you are experiencing a Mental Health or Behavioral Health Crisis or need someone to talk to.  Almarie Shams, RN [place] / [facility] Health RN Care Manager / Transition of Care Direct Dial: [phone]
